# Patient Record
Sex: Male | Born: 1940
Health system: Southern US, Community
[De-identification: ages and names within clinical notes are randomized; demographics above are authoritative.]

## PROBLEM LIST (undated history)

## (undated) DIAGNOSIS — Z9889 Other specified postprocedural states: Secondary | ICD-10-CM

## (undated) DIAGNOSIS — I10 Essential (primary) hypertension: Secondary | ICD-10-CM

## (undated) DIAGNOSIS — I2510 Atherosclerotic heart disease of native coronary artery without angina pectoris: Secondary | ICD-10-CM

## (undated) DIAGNOSIS — E785 Hyperlipidemia, unspecified: Secondary | ICD-10-CM

## (undated) DIAGNOSIS — I428 Other cardiomyopathies: Secondary | ICD-10-CM

## (undated) DIAGNOSIS — Z973 Presence of spectacles and contact lenses: Secondary | ICD-10-CM

## (undated) DIAGNOSIS — R93429 Abnormal radiologic findings on diagnostic imaging of unspecified kidney: Secondary | ICD-10-CM

## (undated) DIAGNOSIS — I480 Paroxysmal atrial fibrillation: Secondary | ICD-10-CM

## (undated) DIAGNOSIS — I498 Other specified cardiac arrhythmias: Secondary | ICD-10-CM

## (undated) DIAGNOSIS — M1990 Unspecified osteoarthritis, unspecified site: Secondary | ICD-10-CM

## (undated) DIAGNOSIS — N1330 Unspecified hydronephrosis: Secondary | ICD-10-CM

## (undated) DIAGNOSIS — Z8679 Personal history of other diseases of the circulatory system: Secondary | ICD-10-CM

## (undated) DIAGNOSIS — E039 Hypothyroidism, unspecified: Secondary | ICD-10-CM

## (undated) DIAGNOSIS — I493 Ventricular premature depolarization: Secondary | ICD-10-CM

## (undated) DIAGNOSIS — K4091 Unilateral inguinal hernia, without obstruction or gangrene, recurrent: Secondary | ICD-10-CM

## (undated) DIAGNOSIS — I499 Cardiac arrhythmia, unspecified: Secondary | ICD-10-CM

## (undated) DIAGNOSIS — I452 Bifascicular block: Secondary | ICD-10-CM

## (undated) DIAGNOSIS — R490 Dysphonia: Secondary | ICD-10-CM

## (undated) HISTORY — PX: TRANSTHORACIC ECHOCARDIOGRAM: SHX275

## (undated) HISTORY — DX: Ventricular premature depolarization: I49.3

## (undated) HISTORY — PX: LOOP RECORDER IMPLANT: SHX5954

## (undated) HISTORY — PX: CARDIAC ELECTROPHYSIOLOGY STUDY AND ABLATION: SHX1294

## (undated) HISTORY — PX: CARDIAC CATHETERIZATION: SHX172

## (undated) HISTORY — PX: OTHER SURGICAL HISTORY: SHX169

## (undated) HISTORY — PX: CARDIOVASCULAR STRESS TEST: SHX262

## (undated) HISTORY — PX: TEE WITH CARDIOVERSION: SHX5442

---

## 1995-03-02 DIAGNOSIS — R490 Dysphonia: Secondary | ICD-10-CM

## 1995-03-02 HISTORY — DX: Dysphonia: R49.0

## 1998-07-14 ENCOUNTER — Emergency Department (HOSPITAL_COMMUNITY): Admission: EM | Admit: 1998-07-14 | Discharge: 1998-07-14 | Payer: Self-pay | Admitting: *Deleted

## 2003-09-09 ENCOUNTER — Inpatient Hospital Stay (HOSPITAL_COMMUNITY): Admission: AD | Admit: 2003-09-09 | Discharge: 2003-09-12 | Payer: Self-pay | Admitting: *Deleted

## 2003-09-10 ENCOUNTER — Encounter (INDEPENDENT_AMBULATORY_CARE_PROVIDER_SITE_OTHER): Payer: Self-pay | Admitting: Cardiology

## 2003-10-10 ENCOUNTER — Ambulatory Visit (HOSPITAL_COMMUNITY): Admission: RE | Admit: 2003-10-10 | Discharge: 2003-10-10 | Payer: Self-pay | Admitting: *Deleted

## 2004-08-26 ENCOUNTER — Ambulatory Visit: Payer: Self-pay | Admitting: Family Medicine

## 2004-08-26 ENCOUNTER — Inpatient Hospital Stay (HOSPITAL_COMMUNITY): Admission: RE | Admit: 2004-08-26 | Discharge: 2004-09-04 | Payer: Self-pay | Admitting: Orthopedic Surgery

## 2004-08-26 HISTORY — PX: TOTAL KNEE ARTHROPLASTY: SHX125

## 2004-08-27 ENCOUNTER — Ambulatory Visit: Payer: Self-pay | Admitting: Orthopedic Surgery

## 2005-01-15 ENCOUNTER — Encounter: Admission: RE | Admit: 2005-01-15 | Discharge: 2005-01-15 | Payer: Self-pay | Admitting: Orthopedic Surgery

## 2005-05-27 ENCOUNTER — Encounter (INDEPENDENT_AMBULATORY_CARE_PROVIDER_SITE_OTHER): Payer: Self-pay | Admitting: Specialist

## 2005-05-27 ENCOUNTER — Ambulatory Visit (HOSPITAL_COMMUNITY): Admission: RE | Admit: 2005-05-27 | Discharge: 2005-05-28 | Payer: Self-pay | Admitting: Surgery

## 2005-08-23 ENCOUNTER — Inpatient Hospital Stay (HOSPITAL_COMMUNITY): Admission: RE | Admit: 2005-08-23 | Discharge: 2005-08-27 | Payer: Self-pay | Admitting: Orthopedic Surgery

## 2005-08-23 DIAGNOSIS — Z96659 Presence of unspecified artificial knee joint: Secondary | ICD-10-CM | POA: Insufficient documentation

## 2005-08-23 HISTORY — PX: TOTAL KNEE ARTHROPLASTY: SHX125

## 2005-09-07 ENCOUNTER — Encounter: Payer: Self-pay | Admitting: Vascular Surgery

## 2005-09-07 ENCOUNTER — Ambulatory Visit (HOSPITAL_COMMUNITY): Admission: RE | Admit: 2005-09-07 | Discharge: 2005-09-07 | Payer: Self-pay | Admitting: Orthopedic Surgery

## 2008-08-08 ENCOUNTER — Encounter: Admission: RE | Admit: 2008-08-08 | Discharge: 2008-08-08 | Payer: Self-pay | Admitting: Cardiology

## 2008-08-14 ENCOUNTER — Ambulatory Visit (HOSPITAL_COMMUNITY): Admission: RE | Admit: 2008-08-14 | Discharge: 2008-08-14 | Payer: Self-pay | Admitting: Cardiology

## 2008-12-30 ENCOUNTER — Encounter: Admission: RE | Admit: 2008-12-30 | Discharge: 2008-12-30 | Payer: Self-pay | Admitting: Sports Medicine

## 2009-08-07 ENCOUNTER — Ambulatory Visit (HOSPITAL_COMMUNITY): Admission: RE | Admit: 2009-08-07 | Discharge: 2009-08-07 | Payer: Self-pay | Admitting: General Surgery

## 2010-05-18 LAB — CBC
HCT: 40.8 % (ref 39.0–52.0)
Hemoglobin: 13.9 g/dL (ref 13.0–17.0)
MCHC: 34.1 g/dL (ref 30.0–36.0)
MCV: 94.8 fL (ref 78.0–100.0)
Platelets: 178 10*3/uL (ref 150–400)
RBC: 4.31 MIL/uL (ref 4.22–5.81)
RDW: 13.7 % (ref 11.5–15.5)
WBC: 8.4 10*3/uL (ref 4.0–10.5)

## 2010-05-18 LAB — BASIC METABOLIC PANEL
BUN: 21 mg/dL (ref 6–23)
CO2: 26 mEq/L (ref 19–32)
Calcium: 9.2 mg/dL (ref 8.4–10.5)
Chloride: 103 mEq/L (ref 96–112)
Creatinine, Ser: 0.82 mg/dL (ref 0.4–1.5)
GFR calc Af Amer: >60 mL/min (ref >60)
GFR calc non Af Amer: >60 mL/min (ref >60)
Glucose, Bld: 90 mg/dL (ref 70–99)
Potassium: 4.7 mEq/L (ref 3.5–5.1)
Sodium: 135 mEq/L (ref 135–145)

## 2010-05-31 DIAGNOSIS — Z9889 Other specified postprocedural states: Secondary | ICD-10-CM

## 2010-05-31 DIAGNOSIS — Z8679 Personal history of other diseases of the circulatory system: Secondary | ICD-10-CM | POA: Insufficient documentation

## 2010-05-31 HISTORY — DX: Personal history of other diseases of the circulatory system: Z86.79

## 2010-05-31 HISTORY — DX: Personal history of other diseases of the circulatory system: Z98.890

## 2010-06-08 LAB — PROTIME-INR
INR: 1.8 — ABNORMAL HIGH (ref 0.00–1.49)
Prothrombin Time: 21.4 seconds — ABNORMAL HIGH (ref 11.6–15.2)

## 2010-07-17 NOTE — Discharge Summary (Signed)
[name], Colton Montgomery               ACCOUNT NO.:  [id number]   MEDICAL RECORD NO.:  [id number]          PATIENT TYPE:  INP   LOCATION:  5034                         FACILITY:  [facility]   PHYSICIAN:  Robert A. Thurston Hole, M.D. DATE OF BIRTH:  1940/11/23   DATE OF ADMISSION:  08/23/2005  DATE OF DISCHARGE:  08/26/2005                                 DISCHARGE SUMMARY   ADMITTING DIAGNOSES:  1.  End-stage degenerative joint disease left knee.  2.  Cardiomyopathy.  3.  Atrial fibrillation.  4.  Hypertension.  5.  History of renal calculi.  6.  History of laryngeal injury with throat surgery x4.   DISCHARGE DIAGNOSES:  1.  End-stage degenerative joint disease left knee status post total knee      replacement.  2.  Cardiomyopathy.  3.  Atrial fibrillation currently in normal sinus rhythm.  4.  Hypertension currently normotensive.  5.  History of renal calculi.  6.  History of laryngeal injury with throat surgery x4.   HISTORY OF PRESENT ILLNESS:  The patient is a 70 year old white male with a  history of end-stage DJD in his left knee. He has failed conservative  treatment. Has known osteoarthritis of both knees. He had a right total  replacement a year ago that had a difficult postoperative course due to  cardiac issues and developing C. difficile as well as urinary retention. We  have discussed the risks, benefits, and possible complications of a left  total knee replacement and is without question.   PROCEDURES IN HOUSE:  On August 23, 2005, the patient underwent a left total  knee replacement by Dr. Thurston Hole and a femoral nerve block by anesthesia. He  was admitted postoperatively for cardiac monitoring, DVT prophylaxis, pain  control, and physical therapy.   HOSPITAL COURSE:  The patient was placed on a CPM in the recovery room and  sent to telemetry for monitoring overnight. He had normal sinus rhythm with  occasional PAC. No runs of atrial fib. He was hypotensive postoperatively,  and  cardiology was consulted to help manage this. We feel the hypotension  was due to the combination of volume loss due to being slightly  anticoagulated and being on multitude of blood pressure medicine.  On exam postoperatively his blood pressure was 92/68. His evening blood  pressures were held. His IV fluids were increased to 100 cc an hour, and he  showed no signs of heart failure. The next morning his blood pressure was up  to 106/67. His Coreg was resumed.      Kirstin Shepperson, P.A.      Robert A. Thurston Hole, M.D.  Electronically Signed    KS/MEDQ  D:  08/26/2005  T:  08/26/2005  Job:  [id number]

## 2010-07-17 NOTE — Op Note (Signed)
NAMEOLAF, MESA               ACCOUNT NO.:  [id number]   MEDICAL RECORD NO.:  [id number]          PATIENT TYPE:  INP   LOCATION:  2550                         FACILITY:  [facility]   PHYSICIAN:  Robert A. Thurston Hole, M.D. DATE OF BIRTH:  October 06, 1940   DATE OF PROCEDURE:  08/26/2004  DATE OF DISCHARGE:                                 OPERATIVE REPORT   PREOPERATIVE DIAGNOSIS:  Right knee degenerative joint disease.   POSTOPERATIVE DIAGNOSIS:  Right knee degenerative joint disease.   PROCEDURE:  1.  Right total knee replacement using DePuy cemented SIGMA total knee      system with #5 cemented femur, #6 cemented tibia with 12.5 mm      polyethylene RP tibial spacer and 41 mm polyethylene cemented patella.  2.  Right total knee computer assisted navigation.   SURGEON:  Elana Alm. Thurston Hole, M.D.   ASSISTANT:  Julien Girt, P.A.   ANESTHESIA:  General.   OPERATIVE TIME:  Two  hours.   ESTIMATED BLOOD LOSS:  50 mL.   COMPLICATIONS:  None.   DESCRIPTION OF PROCEDURE:  Mr. Harr is brought to the operating room on  August 26, 2004, after a femoral nerve block had been placed in the holding  room by anesthesia for postoperative pain control. He is placed on the  operative table in the supine position. After being placed under general  anesthesia. He received Ancef 1 g IV preoperatively for prophylaxis and had  a Foley catheter placed under sterile conditions. His right knee was  examined. He had range of motion from -8 to 125 degrees with a severe varus  deformity of approximately 18 degrees. His knee was stable with normal  patellar tracking. Right leg was prepped using sterile DuraPrep and draped  using sterile technique. Leg was exsanguinated and a thigh tourniquet  elevated 365 mm. Initially through a 15 cm longitudinal incision based over  the patella, initial exposure was made. The underlying subcutaneous tissues  were along with the skin incision. A median arthrotomy was  performed  revealing an excessive amount of normal-appearing joint fluid. The articular  surfaces were inspected. He had complete grade IV chondromalacia and DJD of  the medial, lateral compartment and patellofemoral joint. He had very large  osteophytes on the femoral condyles and tibial plateau which were removed.  Degenerative menisci were removed. There was no remaining anterior cruciate  ligament. There was severe proximal tibial deformity noted. At this point,  two separate pins were placed in the distal femoral metaphysis and two pins  placed in the proximal tibial metaphysis for placement of the computer  navigation system. The system was then activated, the knee taken through a  range of motion, found to have deficiencies and deformities with flexion  contracture of 8 degrees and a varus deformity of 18 degrees. Using the  computer navigation system, an 11 mm distal femoral cut was made in the  appropriate amount of angulation and slope using the computer navigation  system for a perfect cut.  The distal femur was incised. The #5 was felt to  be the appropriate size  based on the computer.  The distal #5 cutting jig  was placed and then these cuts were made also with computer navigation and  verified as well. After this was done then the proximal tibia cut was made  also using computer navigation for resecting 1 mm from the medial or lower  side, 12 mm from the lateral or higher side, again correcting his severe  varus deformity with this navigation cut. After this was done then spacer  blocks were placed for testing the flexion and extension gaps.  These were  found to be equal with 12.5 mm spacers. At this point then the #6 tibial  trial was placed. This was found to give an excellent fit.  There were found  to be very large cysts in the proximal tibial metaphyseal bone.  These were  meticulously cleaned out so that they could be packed with cement and the  end of the case. The #6  tibial tray was placed and the keel cut was made.  After this was done then the PCL box cutter was placed on the distal femur  and then these cuts were made. At this point, the #6 tibial trial was  placed. The #5 femoral trial was placed and with a 12.5 mm polyethylene RP  tibial spacer there was found be excellent restoration of normal alignment  and excellent stability and excellent correction of his deformities.  At  this point, the patella was sized. The patella measured 30 mm in thickness  and a resurfacing 10 mm cut was made and then three locking holes were  placed. After this was done, patellofemoral tracking was evaluated and this  was found to be normal. At this point, it was felt that all the trial  components were of excellent size, fit and stability. They were then  removed.  The knee was then jet lavage irrigated with 3 liters of saline  solution and the proximal tibia was exposed. The #6 tibial tray with cement  backing was placed after the large cysts were filled with cement and then  the tibial tray was hammered into position with an excellent fit, with  excess cement being removed from around the edges.  The #5 femoral component  with cement backing was hammered in position also with an excellent fit,  with excess cement being removed from around the edges. The 12.5 mm  polyethylene RP tibial spacer was then placed on the tibial tray. The knee  was reduced, taken through a range of motion from -3 to 125 degrees with  excellent correction of his varus and flexion deformities. The 41 mm  polyethylene cement backed patella was then placed and held in position with  a clamp. After the cement hardened, patellofemoral tracking was again  evaluated and this was found to be normal. At this point, it was felt that  all components were excellent size, fit and stability. The knee was further  irrigated with saline and then the tourniquet was released. Hemostasis was obtained with  cautery. The arthrotomy was then closed with #1 Ethibond  suture over two medium Hemovac drains. Subcutaneous tissues closed with 0  and 2-0 Vicryl, skin closed with subcuticular 4-0 Monocryl. Steri-Strips  were applied. Sterile dressings were applied. The  Hemovac injected with  0.25% Marcaine with epinephrine and 4 mg of morphine. Knee immobilizer  placed and the patient awakened and taken to recovery in stable condition.  Needle and sponge counts correct x2 at the end of the case.  RAW/MEDQ  D:  08/26/2004  T:  08/26/2004  Job:  [id number]

## 2010-07-17 NOTE — Discharge Summary (Signed)
NAMEIMAN, Colton Montgomery               ACCOUNT NO.:  [id number]   MEDICAL RECORD NO.:  [id number]          PATIENT TYPE:  INP   LOCATION:  5015                         FACILITY:  [facility]   PHYSICIAN:  Robert A. Thurston Hole, M.D. DATE OF BIRTH:  [birth year]   DATE OF ADMISSION:  08/26/2004  DATE OF DISCHARGE:  09/04/2004                                 DISCHARGE SUMMARY   ADMITTING DIAGNOSES:  1.  End-stage degenerative joint disease right knee.  2.  Chronic obstructive pulmonary disease, with ongoing tobacco use.  3.  Paroxysmal atrial fibrillation, with ejection fraction of 25% on September 02, 2004.  4.  History of kidney stones.   DISCHARGE DIAGNOSES:  1.  End-stage degenerative joint disease right knee, status post total knee      replacement.  2.  Chronic obstructive pulmonary disease.  Stable.  No tobacco use or      nicotine patch since August 26, 2004.  3.  Paroxysmal atrial fibrillation, currently in normal sinus rhythm.  4.  Urinary retention.  5.  History of confusion, now resolved.   HISTORY AND PRESENT ILLNESS:  The patient is a 70 year old white male with a  history of bilateral end-stage DJD of both knees.  Tried conservative care,  including antiinflammatories and cortisone injections without success.  At  this point in time, he has pain at night, pain with rest, pain unrelieved by  medication, and understands the risks, benefits, and possible complications  of a right total knee replacement and is without question.   Due to his significant cardiac history, he received cardiac clearance from  Dr. Jenne Campus at [facility] who will follow him during his  hospital course.   PROCEDURES IN-HOUSE:  On August 26, 2004, the patient underwent a right total  knee replacement by Dr. Thurston Hole and a femoral nerve block by anesthesia.  He  tolerated both procedures well.   He was placed in cardiac intensive care postoperatively due to his history  of paroxysmal atrial  fibrillation and his ejection fraction of 25%.  Postop  day one, vital signs were stable.  The patient was afebrile.  He was  neurovascularly intact.  His hemoglobin was 12.9.  Sodium 132, potassium  4.5.  His PCA was 4.5.  His PCA was discontinued.  He was started on  OxyContin 20 mg b.i.d. and Oxy-IR.  He was in atrial fibrillation and was  getting Lovenox for DVT prophylaxis as well as Coumadin.  Postop day two,  the patient was transferred to telemetry.  His sodium was 126.  His INR was  2.3.  He had a T-max of 100.  His blood pressure was 94/66.  He did have  some gross blood in his urine.  A UA was ordered.  A chest x-ray was  ordered.  His IV was hep-locked.  Urology was consulted due to gross  hematuria in his catheter.  Urology recommended his catheter remain in and  felt the hematuria was due to his Coumadin use and so it was best monitored  with the catheter left in.  He continued to be slightly hypotensive.  His  blood pressure medicines were adjusted by cardiology.  He went in and out of  atrial flutter and atrial fibrillation into sinus rhythm.  He was evaluated  for possible rehab versus [facility] admission.  [facility] denied  subacute.  He is still being followed for rehab.  On postop day three, blood  pressure was 105/75.  He returned from sinus rhythm back into atrial  fibrillation.  The patient began to have some confusion and was unable to  participate very well in physical therapy due to his new onset confusion.  On postop day four, hemoglobin was 8.5.  Sodium was 1.9.  Surgical wound was  well approximated.  Blood pressure was 93/76.  He was in sinus rhythm with  PVCs.  He was stable from a cardiac standpoint and transferred to the  orthopedic floor.  At that time, his confusion was so significant he was not  able to follow instructions and was getting out of bed and was unsafe.  He  was placed in restraints.  He was given a unit of blood.  He was oriented to   person, place, and situation, but still very inappropriate.  Was not  following safety instructions.  Had a T-max of 101.  Had a postvoid residual  of 600 cc.  Ativan was started for his agitation which we thought could be  alcohol or nicotine withdrawal.  Chest x-ray, UA with culture and  sensitivity, and blood cultures were ordered.  He was given 1 unit of blood  for his postop blood loss anemia as well as his hypotension.  He was given a  Dulcolax suppository.  Postop day five, the patient was once again oriented  to person, place, and situation when asked appropriate questions but still  disoriented.  His INR was 3.5.  His hemoglobin was 8.5.  His UA had white  cells and bacteria in it as well as blood.  He was started on clindamycin.  His surgical wound was well approximated.  Had a moderate amount of  drainage, with broken blisters.  Postop day six, patient still was unable to  clear his confusion despite being oriented to person and place.  Family  practice was consulted.  They discontinued all narcotics, discontinued his  Ativan, gave a one-time dose of Haldol, and the patient continued to be very  agitated.  He was given a second unit of blood.  His blood pressure improved  to 122/68; T-max of 100.2.  His hemoglobin was 70 to 11.3, with 2 units of  blood.  His INR was 3.  He converted back to atrial fibrillation.  His  Avapro was restarted now that his hypotension was improved.  Postop day  seven, the patient still continued to be somewhat confused but clearing  slightly.  His chest x-ray was clear.  His urine culture did not grow  anything.  Therefore, his Foley was discontinued again.  Postop day eight,  the patient's biggest complaint was a slight bit of urinary retention.  Urecholine was switched to Flomax 0.4 mg.  Dressing changes were ordered.  He progressed well with physical therapy.  Clindamycin was switched to Keflex due to just treating his wound.  Postop day nine, blood  pressures  95/68; pulse 65; respirations 16; O2 saturations are between 94-98%.  His  INR is 2.3.  He is being discharged to a skilled nursing facility in stable  condition, weightbearing as tolerated, on a regular  diet.  He needs daily  dressing changes.  Swab the wound with Betadine.  Place Adaptic over the  wound and dry 4 x 4s, and then place his TED hose over the wound.   DISCHARGE MEDICATIONS:  1.  Avapro 75 mg one p.o. daily.  2.  Coreg 25 mg one p.o. b.i.d.  3.  Inspra 25 mg one p.o. Monday, Wednesday, and Friday.  4.  Digoxin 0.125 mg Tuesday, Thursday, Saturday, and Sunday.  5.  Amiodarone 200 mg p.o. q.a.m.  6.  Diovan 80 mg p.o. q.a.m.  7.  Vytorin 10/20 one p.o. at bedtime.  8.  Coumadin 1.25 mg Tuesday, Thursday, Saturday, and Sunday.  9.  Coumadin 2.5 mg Monday, Wednesday, and Friday.  10. Ultram 50 mg 1-2 p.o. q.4-6 h. p.r.n. pain.   He needs a CPM machine 0-90 degrees eight hours a day for two weeks.  Every  morning, his right heel needs to be placed on a folded pillow to elevate his  leg and work on extension of his knee. This needs to be done for 30 minutes  every morning.  He needs physical therapy daily for ambulation, balance,  range of motion of his knee.  He is to  follow up with Dr. Thurston Hole in one week for suture removal and x-rays.  Our  office number is [phone].  Please call for an appointment.  Call [phone]  with increased pain, increased redness, increased drainage, or a temperature  greater than 101.       KS/MEDQ  D:  09/04/2004  T:  09/04/2004  Job:  [id number]

## 2010-07-17 NOTE — Consult Note (Signed)
NAMEGAELAN, Colton Montgomery               ACCOUNT NO.:  [id number]   MEDICAL RECORD NO.:  [id number]          PATIENT TYPE:  INP   LOCATION:  5015                         FACILITY:  [facility]   PHYSICIAN:  Altamese Cabal, M.D.  DATE OF BIRTH:  1941-02-03   DATE OF CONSULTATION:  09/01/2004  DATE OF DISCHARGE:                                   CONSULTATION   REASON FOR CONSULTATION:  Confusion.Marland Kitchen   HISTORY OF PRESENT ILLNESS:  This is a 70 year old white male with COPD,  atrial fibrillation, CHF, hyperlipidemia, hypertension, who was admitted to  the hospital for a right total replacement six days ago.  His primary team  asked [place] to come and evaluate the patient for confusion x2 days.  Per the  nurses, the patient had been intermittently confused, meaning that he did  now always respond appropriately to questions, frequently needs redirection,  has a decreased attention span, and is disoriented at times.  The patient  has become agitated enough at times to require restraints and is presently  getting Ativan as needed.  The patient has also experienced hypotension  since his operation, with blood pressures ranging from the 60s to 90s over  40s to 60s.  These have since increased in the last day and are now around  100/60.  The patient is also being treated for a UTI and is on day #2 of  Clindamycin.  The patient's son is here today and he believes his father is  very confused and that this began immediately following surgery.  The son  states his father is typically a high functioning individual and is a high  Engineer, site.  He has never had problems like this before.  Both the  patient and son deny him having recent alcohol use.  The son does state that  in the past he has had an alcohol abuse problem though.  Currently the  patient is having right knee pain as well as nausea, and an occasional  questionably productive cough.  His last bowel movement was three days ago.  The patient denies  chest pain, shortness of breath dizziness, headaches,  visual changes, fever, chills, and weakness, numbness, or tingling in his  extremities.   PAST MEDICAL HISTORY:  1.  COPD:  Not on home O2.  2.  Atrial fibrillation:  Presently on Coumadin.  3.  CHF:  Ejection fraction of 20-25% on echo in July 2005 and evidence of      ischemic cardiomyopathy.  4.  Hypertension.  5.  Hyperlipidemia.  6.  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  7.  History of right inguinal hernia.   PAST SURGICAL HISTORY:  1.  Status post right total knee replacement six days ago.  2.  Left knee arthroscopy, 1981.  3.  Larynx repair, 1994, x3.   HOSPITAL MEDICATIONS:  1.  Amiodarone 200 mg p.o. b.i.d.  2.  Aspirin 81 mg p.o. daily.  3.  __________25 mg p.o. t.i.d.  4.  Coreg 25 mg p.o. b.i.d.  5.  Clindamycin 300 mg p.o. q.i.d., day #2.  6.  Lanoxin  0.125 mg per cardiologist.  7.  Colace 100 mg p.o. b.i.d.  8.  Zetia 10 mg p.o. daily.  9.  Senna p.r.n.  10. Zocor 20 mg p.o. daily.  11. Coumadin per cardiologist.  12. Thiamine 100 mg p.o. daily.  13. Ativan 1 mg p.o. every six hours as needed.  14. Robaxin p.r.n.  15. Reglan p.r.n.  16. Restoril 15-30 mg p.o. q.h.s. p.r.n.  17. Oxycodone 5 mg p.o. q.4h. p.r.n.  18. Percocet 1-2 tabs p.o. q.4h. p.r.n.  19. Phenergan p.r.n.   FAMILY HISTORY:  Significant for father with Alzheimer's, deceased in 44s.  No family history of stroke, brain cancer, or seizures.   SOCIAL HISTORY:  The patient lives alone and is divorced.  He is a Runner, broadcasting/film/video  at [employer].  He denies alcohol and drug use.  He admits to  smoking about a pack a day.   PHYSICAL EXAMINATION:  VITAL SIGNS:  Temperature 97.6, blood pressure 101-  114/55-70, pulse 70, respirations 20.  Oxygen 91-93% on 2 L nasal cannula,  24 hour I's & O's:  480/1980.  GENERAL:  The patient is calm.  He is sleepy but arouseable.  He is oriented  to his name but not to the place, time, or  situation.  His attention span is  also decreased.  The patient is cooperative but does not respond  appropriately to questions and demonstrates tangential speech.  SKIN:  There is a 6 cm oval ecchymosis in the right lower quadrant and an  underlying nodule is palpated.  HEENT:  Head normocephalic, atraumatic.  No nuchal rigidity.  Pupils equal,  round, and reactive to light and accommodation.  Extraocular movements  intact.  Oropharynx clear.  Dentition is fair.  LYMPHS:  No cervical or inguinal nodes palpable.  LUNGS:  Decreased air movement throughout, otherwise clear to auscultation  bilaterally.  CARDIOVASCULAR:  Regular rate and rhythm.  No murmurs, rubs, or gallops.  ABDOMEN:  Positive bowel sounds.  Soft, nontender, and nondistended.  EXTREMITIES:  His right knee is immobilized.  His incision is clean, dry,  and intact.  NEUROLOGIC:  Sensation is 5/5 throughout, normal sensation throughout.  Cranial nerves II-XII grossly intact.   LABORATORY DATA:  Sodium 130, potassium 4.2, chloride 101, bicarb 24, BUN  15, creatinine 0.9, glucose 117.  White blood cell count 11.3, hemoglobin  10.1, hematocrit 29.5, platelets 287.  UA with moderate loops.  Urine micro:  7-10 white blood cells, 21-50 red blood cells.  Urine culture:  No growth,  final.  Blood culture:  No growth at the time of two days.  PT 35.4, INR  3.5.  AST 62, ALT 43.  Total bilirubin 1.1, alkaline phosphatase 46, total  protein 5.6, albumin 2.2.   Chest x-ray on July 2 demonstrated linear atelectasis in the lingula and  right middle lobe.   ASSESSMENT/PLAN:  This is a 70 year old male, status post right total knee  replacement, presenting with acute confusion x2 days.  1.  Delirium.  We feel that the acute nature and waxing and waning mental      status are consistent with delirium.  At baseline the patient is an     independent and high functioning individual.  The differential for this      includes medications,  metabolic abnormalities, constipation, and      infection.  The patient's low O2 saturations and productive cough      warrant a chest x-ray and ABG to evaluate for  hypoxia as well as for an      infiltrate.  Urine culture is negative.  We may consider stopping the      antibiotics and we have not identified a source of infection yet.  The      patient's wound did not seem to be infected either.  We recommend      holding the medications that may be contributing to his delirium, such      as Ativan, Restoril, Reglan, and Phenergan.  Instead of Ativan,      recommend using Haldol 1 mg for agitation.  Also should consider      discontinuing the Percocet and adding Ultram in its place.  The patient      did not have any focal neurology extensions or head trauma, so we doubt      a CVA or epidural hematoma is responsible for his condition.  The      patient's son also denies that he uses alcohol, so we doubt that this is      alcohol withdrawal, especially given that he has no tachycardias or      tremors or other signs of acute withdrawal.  The patient also has not      had a bowel movement in three days and a more aggressive bowel regimen      may be helpful since constipation can cause delirium.  We also recommend      checking a TSH.  2.  Increased LFTs.  His levels were normal before admission, so this is an      acute change.  We will check an ammonia level for possible hepatic      encephalopathy.  We also will consider a right upper quadrant ultrasound      at a later time.  It is possible that he has shock liver from      hypotension during surgery.  That is also in the differential.  3.  Anemia.  He is status post transfusion.  We recommend keeping the      hemoglobin above 10 due to his coronary artery disease.  4.  Congestive heart failure.  This is stable.  The cardiologist is      managing.  5.  Atrial fibrillation.  This is also being managed per the patient's       cardiologist.       KS/MEDQ  D:  09/01/2004  T:  09/01/2004  Job:  [id number]

## 2010-07-17 NOTE — Op Note (Signed)
Colton Montgomery, Colton Montgomery               ACCOUNT NO.:  [id number]   MEDICAL RECORD NO.:  [id number]          PATIENT TYPE:  INP   LOCATION:  2550                         FACILITY:  [facility]   PHYSICIAN:  Robert A. Thurston Hole, M.D. DATE OF BIRTH:  Jan 03, 1941   DATE OF PROCEDURE:  08/23/2005  DATE OF DISCHARGE:                                 OPERATIVE REPORT   PREOPERATIVE DIAGNOSIS:  Left knee degenerative joint disease.   POSTOPERATIVE DIAGNOSIS:  Left knee degenerative joint disease.   PROCEDURE:  1.  Left total knee replacement using DePuy cemented total knee system with      #5 cemented femur, #6 cemented tibia, with 12.5 mm polyethylene RP      tibial spacer and 38 mm polyethylene cemented patella.  2.  Left total knee computer assisted navigation.   SURGEON:  Elana Alm. Thurston Hole, M.D.   ASSISTANT:  Julien Girt, P.A.   ANESTHESIA:  General.   OPERATIVE TIME:  1 hour and 40 minutes.   COMPLICATIONS:  None.   DESCRIPTION OF PROCEDURE:  Mr. Mcdonagh is brought to the operating room on  August 23, 2005, placed on the operative table in the supine position after a  femoral nerve block was placed in the holding room by Anesthesia.  After  being placed under general anesthesia he had a Foley catheter placed under  sterile conditions.  He received vancomycin 1g IV preoperatively for  prophylaxis.  His left knee was examined under anesthesia.  Range of motion  from -8 to 125 degrees with significant varus deformity, knee stable  ligamentous exam with normal patella tracking.  The left leg was prepped  using sterile DuraPrep and draped using sterile technique.  Leg was  exsanguinated and a thigh tourniquet elevated at 365 mm.  Initially through  a 15 cm longitudinal incision based over the patella initial exposure was  made.  The underlying subcutaneous tissues were incised along with the skin  incision.  A median arthrotomy was performed revealing an excessive amount  of normal appearing  joint fluid.  The articular surfaces were inspected.  He  had grade 4 changes medially, grade 3 and 4 changes laterally and grade 3  and 4 changes at the patellofemoral joint.  Osteophytes were removed off the  femoral condyles and tibial plateau.  Medial and lateral meniscal remnants  were removed as well as the anterior cruciate ligament.  At this point two  pins were placed in the proximal tibial metaphysis and two pins in the  distal femoral metaphysis for placement of the computer navigation system.  The navigation system was set up and activated.  Deformities were  registered.  He had a 10 degree flexion contracture and a 17 degree varus  deformity.  At this point, the distal femoral cut was made using the  computer navigation system, making a perfect and exact cut, resecting 12 mm  off the distal femur, this cut was verified.  The sizing guide was used  along with the navigation system to determine the #5 size as the appropriate  size.  With the #5 cutting jig in  place, the anterior, posterior and chamfer  cuts were made and verified as well.  At this point, the proximal tibial  guide was placed with the navigation system, resecting 11 mm off the lateral  and 2 mm off the medial side as a correcting cut.  This cut was made and  also verified as well and found to be exact.  Spacer blocker were then used,  a 12.5 mm flexion block was used and a 12.5 mm extension block.  This was  found to give excellent correction of his flexion and varus deformities  noted and excellent stability and balancing noted as well.  At this point,  the #6 tibial base plate tray was placed and the keel cut was made.  The PCL  box cutter was placed on the distal femur and these cuts were made.  The #5  femoral trial was placed and with a #6 tibial base plate and a 60.4 mm  polyethylene RP tibial spacer the knee was reduced, taken through a range of  motion from 0-125 degrees with excellent stability and  excellent correction  of his flexion and varus deformities.  The patella was sized.  A resurfacing  10 mm cut was made and three locking holes were placed for a 38 mm patella  and with the patella trial in place patellofemoral tracking was evaluated  and this was found to be normal.  At this point, it was felt that all of the  components were of excellent size, fit and stability.  The navigation system  was deactivated and the pins were removed.  The trial components were  removed.  The knee was then gent lavage irrigated with 3L of saline.  The  proximal tibia was then exposed and a #6 tibial base plate with cement  backing was hammered into position with an excellent fit, with excess cement  being removed from around the edges.  A #5 femoral component was cemented  back and was hammered into position also with an excellent fit, with excess  cement being removed from around the edges.  The 12.5 mm polyethylene RP  tibial spacer was placed on the tibial base plate, the knee reduced, taken  through a range of motion from 0-125 degrees with excellent stability and  excellent correction of his flexion and varus deformities.  The 38 mm  polyethylene cement backed patella was placed in its position and held there  with a clamp.  After the cement hardened patellofemoral tracking was again  evaluated and this was found to be normal.  At this point, it was felt that  all of the components were of excellent size, fit and stability.  The knee  was further irrigated with saline and then the tourniquet was released,  hemostasis obtained with cautery.  The arthrotomy was then closed with #1  Ethibond suture over two medium Hemovac drains, subcutaneous tissues closed  with #0 and #2-0 Vicryl, subcuticular layer closed with #3-0 Monocryl, Steri-  Strips were applied, sterile dressings and a long leg splint applied, the Hemovac injected with 0.25% Marcaine with epinephrine and 4 mg of morphine  and  clamped.  Patient then awakened, extubated and taken to the recovery  room in stable condition.  Needle and sponge counts correct x2 at the end of  the case.      Robert A. Thurston Hole, M.D.  Electronically Signed     RAW/MEDQ  D:  08/23/2005  T:  08/23/2005  Job:  [id number]

## 2010-07-17 NOTE — Consult Note (Signed)
Colton Montgomery, Colton Montgomery               ACCOUNT NO.:  [id number]   MEDICAL RECORD NO.:  [id number]          PATIENT TYPE:  INP   LOCATION:  3735                         FACILITY:  [facility]   PHYSICIAN:  Ronald L. Earlene Plater, M.D.  DATE OF BIRTH:  1940/12/04   DATE OF CONSULTATION:  DATE OF DISCHARGE:                                   CONSULTATION   DATE OF UROLOGY CONSULTATION:  August 28, 2004.   REASON FOR CONSULTATION:  Gross hematuria seen in catheterization.   HISTORY OF PRESENT ILLNESS:  Patient is admitted for right total knee  replacement by Dr. Thurston Hole for right knee degenerative joint disease.  Also  has a history of atrial fibrillation with control on Coumadin and found to  have pink urine in catheter bag this a.m.  No complaints of abdominal pain.  Catheter was removed around noon today without spontaneous void as yet.   UROLOGICAL HISTORY:  He has had previous histories of kidney stones in the  1980s, some occasional frequency and nocturia.   PAST MEDICAL HISTORY:  1.  Hypertension.  2.  Hyperlipidemia.  3.  Atrial fibrillation.  4.  Separate vocal cords causing chronic hoarseness.  5.  Right inguinal hernia.   PAST SURGICAL HISTORY:  1.  Left knee arthroscopy in 1981.  2.  Left meniscectomy in 1961.  3.  Larynx surgery in 1994 with laser x3.   MEDICATIONS:  1.  Coreg 25 mg b.i.d.  2.  Inspra 25 mg every other day.  3.  Amiodarone 20 mg daily.  4.  Diovan 80 mg daily.  5.  Digoxin 0.125 mg every other day.  6.  Vytorin 10/20 daily.  7.  ASA 81 mg daily.  8.  Coumadin 2.5 mg Monday, Wednesday, Friday.  9.  Coumadin 1.25 mg Tuesday, Thursday, Saturday, and Sunday.   ALLERGIES:  TETRACYCLINE causes rash.   SOCIAL HISTORY:  Occasional alcohol use.  One pack per day smoker for 30  years.   REVIEW OF SYSTEMS:  Pain to right knee.  There is chest pain.  No shortness  of breath.  Otherwise negative.   PHYSICAL EXAMINATION:  GENERAL:  A 70 year old white male in no acute  distress.  VITAL SIGNS:  Stable.  HEENT:  Atraumatic, normocephalic, a hoarse speaking voice.  NECK:  Negative JVD.  ABDOMEN:  Soft and nontender.  GU:  Penis and meatus without lesions.  Dry blood at tip of penis.  SKIN:  Warm and dry.  NEUROLOGIC:  He is alert and oriented x3.   LABORATORY DATA:  Negative urine culture on August 26, 2004.  WBCs 18.1,  hemoglobin 11.1, hematocrit of 33.1, platelets 166.  Sodium 126, potassium  3.8, chloride 97, CO2 23, BUN 22, creatinine 1.0, glucose 128, and calcium  7.7.  His PT 25.1 and INR 23.   IMPRESSION:  1.  Status post right total knee replacement.  2.  Atrial fibrillation.  3.  Hematuria secondary to Coumadin use and catheter.   PLAN:  If no spontaneous void may replace catheter.  Have patient follow up  in our office status post  discharge from hospital with Cammy Copa,  Nurse Practitioner, [facility], number is [phone], ext. [phone].       JML/MEDQ  D:  08/28/2004  T:  08/28/2004  Job:  [id number]

## 2010-07-17 NOTE — Discharge Summary (Signed)
NAMECONROY, GORACKE               ACCOUNT NO.:  [id number]   MEDICAL RECORD NO.:  [id number]          PATIENT TYPE:  INP   LOCATION:  5015                         FACILITY:  [facility]   PHYSICIAN:  Robert A. Thurston Hole, M.D. DATE OF BIRTH:  September 14, 1940   DATE OF ADMISSION:  08/26/2004  DATE OF DISCHARGE:                                 DISCHARGE SUMMARY   ADDENDUM:  job [id number]   Due to urinary retention issues patient had catheter inserted this morning  which is September 04, 2004.  Catheter will remain in place.  He will need a  follow-up with Dr. Vonita Moss who is his urologist some time next week for a  trial.  He will continue on Flomax 0.4 mg p.o. daily until his follow-up  appointment with Dr. Vonita Moss.  He will need b.i.d. dressing changes to his  right leg that has serosanguineous drainage, swab the wound with Betadine,  place adaptic over it, 4 x 4s, ABDs, hold in place with ted hose.  He has a  moderate amount of serous drainage at this point in time.  Please call [phone], Dr. Sherene Sires office, if drainage increases, redness increases,  swelling increases, or pain increases.       KS/MEDQ  D:  09/04/2004  T:  09/04/2004  Job:  [id number]

## 2010-07-17 NOTE — Discharge Summary (Signed)
NAME:  Colton Montgomery, Colton Montgomery                         ACCOUNT NO.:  [id number]   MEDICAL RECORD NO.:  [id number]                   PATIENT TYPE:  INP   LOCATION:  3739                                 FACILITY:  [facility]   PHYSICIAN:  Darlin Priestly, M.D.             DATE OF BIRTH:  December 15, 1940   DATE OF ADMISSION:  09/09/2003  DATE OF DISCHARGE:  09/12/2003                                 DISCHARGE SUMMARY   ADMISSION DIAGNOSES:  1. New-onset atrial fibrillation undetermined duration, asymptomatic.  2. Mild dyspnea on exertion.  3. Chronic obstructive pulmonary disease with ongoing tobacco use.  4. Degenerative joint disease with scheduled right knee replacement and left     knee arthroscopy.   DISCHARGE DIAGNOSES:  1. New found asymptomatic atrial fibrillation, duration unknown.  2. Nonischemic cardiomyopathy with ejection fraction of 20-25%.  3. Left to right atrial shunt by transesophageal echocardiogram, left     ventricular dilatation, left atrial dilatation by transesophageal     echocardiogram.  4. Noncritical coronary artery disease with 50% mid right coronary artery,     proximal 30% distal, 40% left anterior descending stenosis, 30% obtuse     marginal-2 stenosis.  5. Ongoing tobacco use.  6. Degenerative joint disease of the back, shoulders and knees.   PROCEDURES:  1. Transesophageal echocardiogram on September 10, 2003.  2. Cardiac catheterization on September 11, 2003, by Dr. Jenne Campus.   HISTORY OF PRESENT ILLNESS:  The patient is a 70 year old, white male who  was scheduled for arthroscopy of this left knee to be followed by right knee  replacement on August 23, 2003.  Upon presentation for surgery, he was found  to be in atrial fibrillation and referred to our office for evaluation.  The  patient had no indication that he was in atrial fibrillation.  He has no  history of chest pain or coronary artery disease.  He did have some dyspnea  on exertion which he attributed to smoking  and his degenerative joint  disease.   PAST MEDICAL HISTORY:  Unremarkable.   PAST SURGICAL HISTORY:  1. Vocal surgery in the past secondary to an accident.  This required four     procedures.  He has a hoarse voice from that.  2. Right inguinal hernia repair.  3. Kidney stones.  4. Arthritis in both knees which are fairly severe.  He takes Tylenol and     ibuprofen.   ALLERGIES:  No known drug allergies.   SOCIAL HISTORY:  He is divorced with one child.  He is a Runner, broadcasting/film/video at  [employer].  He smokes one pack a day and drinks  occasionally.   FAMILY HISTORY:  Noncontributory.   REVIEW OF SYMPTOMS:  Unremarkable.   ASSESSMENT:  He was recommended to undergo admission at this time and to be  placed on heparin and Coumadin.  The patient refused and took the risk of  using  aspirin only.  He returned for follow up on August 30, 2003.  During the  interim, he had undergone a 2D echocardiogram and a Cardiolite.  The 2D  echocardiogram showed a severely depressed left ventricular systolic  function estimated at 20%.  Cardiolite scan revealed an inferior wall  artifact, but no significant anemia.  He was finally convinced to allow  himself to be admitted with plans to place him on intravenous heparin,  cardiac catheterization and transesophageal echocardiogram guided  cardioversion assuming that he had no clot at the time of Coumadin.   MEDICATIONS ON ADMISSION:  1. Aspirin 325 mg daily.  2. Ibuprofen p.r.n.  3. Tylenol p.r.n. for his arthritis.   HOSPITAL COURSE:  The patient was admitted and underwent TEE.  This showed  mildly dilated left ventricle.  Overall, ventricular systolic function was  markedly decreased and estimated to be 25%.  There was severe diffuse LV  hypokinesis.  There was trivial aortic valvular regurgitation and left  atrial appendage.  Emptying velocities were less than 20 cm per second, left  atrium was markedly dilated.  There was left atrial  appendage function  severely reduced.  He had a normal right ventricle.  The right atrium was  mildly dilated and there was evidence for a mild left to right shunt by  contrast with agitated saline.  It was recommended at this point that he  undergo anticoagulation prior to DC cardioversion.  On September 11, 2003, he  underwent cardiac catheterization by [facility].  This showed a noncritical  coronary disease.  He had a 50% mid RCA stenosis.  He had a 30% proximal  before the diagonal-1 and 40% distal one-third stenosis of the LAD.  His  circumflex was normal.  The OM-2 had a proximal one-third 30% stenosis.  His  left ventricular function was estimated at 20% with global hypokinesis.  At  that point, it was Dr. Mikey Bussing opinion that she should resume heparin and  begin Coumadin and he could go home when his INR was greater than or equal  to 2.0.  Today, the patient is extremely anxious to go home.  His  catheterization site looks good.  He is having no chest discomfort and no  shortness of breath.  His heart rate is in the 70s-90s, so his rate is  controlled.  He remains in fibrillation/flutter rhythm.  His blood pressure  is adequate between 108/80 and as high as 132/92.  Hemoglobin is 16,  hematocrit 46.5, white count 12.1 and platelets 218.  Electrolytes are  normal with BUN 16 and creatinine 0.8.  His protime is 14.8 with an INR of  1.2.  The patient has had 10 mg of Coumadin so far.  At this point, we  contacted case manager and the patient is eligible for Lovenox through his  insurance.  We are going to arrange that and when he is seen by a physician,  we will plan to discharge him home.   DISCHARGE MEDICATIONS:  1. Lovenox 80 mg p.o. b.i.d., four days worth with one refill.  2. Coumadin 10 mg on Thursday, 10 mg on Friday, 5 mg on Saturday, 5 mg on     Sunday and he will return on Monday a.m. for a protime.  He will be    followed by Dr. Mikey Bussing office.  3. Altace 2.5 mg q.12h.  4.  Coreg 3.125 mg q.12h.  5. Aspirin 81 mg daily.  6. Lanoxin 0.125 mg q.d.  7. Advil 800  mg tablets q.12h.  8. Tylenol for pain control.   ACTIVITY:  Light to moderate activity as tolerated.   SPECIAL INSTRUCTIONS:  He is to call if he has any problems with his cardiac  catheterization site.  He is to contact Dr. Sherene Sires office for a follow-up  appointment.  I did talk with his P.A. and she knows he will not be operated  on with an EF of 20%.  We have also advised the patient to discontinue  smoking.  The patient does not have a primary care or medical physician.   FOLLOW UP:  Will schedule him to come back to see Dr. Jenne Campus in  approximately two weeks.  We will have his protime checked Monday and place  him on Coumadin protocol at the lab or his choice so he can maintain  adequate INR.   CONDITION ON DISCHARGE:  Stable.      Eber Hong, P.A.                 Darlin Priestly, M.D.    WDJ/MEDQ  D:  09/12/2003  T:  09/12/2003  Job:  [id number]   cc:   Molly Maduro A. Thurston Hole, M.D.  [address]  [place] [address]  Fax: [phone]

## 2010-07-17 NOTE — Cardiovascular Report (Signed)
NAME:  Colton Montgomery, Colton Montgomery                         ACCOUNT NO.:  [id number]   MEDICAL RECORD NO.:  [id number]                   PATIENT TYPE:  INP   LOCATION:  3739                                 FACILITY:  [facility]   PHYSICIAN:  Darlin Priestly, M.D.             DATE OF BIRTH:  [birth year]   DATE OF PROCEDURE:  09/11/2003  DATE OF DISCHARGE:                              CARDIAC CATHETERIZATION   PROCEDURES:  1. Right heart catheterization.  2. Left heart catheterization.  3. Coronary angiography.  4. Left ventriculogram.  5. Ascending aortography.   ATTENDING PHYSICIAN:  Darlin Priestly, M.D.   COMPLICATIONS:  None.   INDICATIONS:  Colton Montgomery is a 70 year old male with no prior cardiac  history who was initially referred to our office through Dr. Salvatore Marvel  after the patient was noted to have new onset atrial fibrillation in  preparation for knee surgery.  Subsequent 2-D echocardiogram revealed EF of  approximately 20%.  He subsequently was admitted to the hospital for IV  heparin, Coumadin, possible TE guided cardioversion and cardiac  catheterization to rule out significant coronary artery disease.   DESCRIPTION OF OPERATION:  After obtaining informed written consent, the  patient was brought to the cardiac catheterization laboratory.  Right and  left groin were  shaved and prepped and draped in the usual sterile fashion.  ECG monitor was  established.  Using the modified Seldinger technique, a 8 French arterial  sheath was inserted in the right femoral vein, 6 French arterial sheath  inserted in the right femoral artery.  Next, under fluoroscopic guidance a 7  French Swan-Ganz catheter was then floated into the RA, RV, PA and wedge  position and hemodynamic measurements were made.  6 French diagnostic  catheter was then used to perform diagnostic angiography.  This reveals a  large, but short left main with no significant disease. The LAD is a large  vessel which  coursed to the apex with one diagonal branch.  The LAD is noted  to have some mild irregularities up to 30% in its proximal portion with 40%  mid vessel irregularity.  First diagonal is a medium size vessel with no  significant disease.   Left circumflex is a medium size vessel which courses in the AV groove and  gives rise to three obtuse marginal branches.  The AV groove circumflex has  no significant disease.  The first OM is a medium size vessel with no  significant disease.  The second OM is a large vessel with 30% proximal  stenosis.  The third OM is a small vessel with no significant disease.   The right coronary artery is a large vessel which is dominant and gives rise  to both PDA as well as posterior lateral branch.  There is mild 50% early  mid vessel stenosis.  The PDA and posterior lateral branch have no  significant disease.   LEFT VENTRICULOGRAM:  Left ventriculogram reveals severely depressed EF of  20% with global hypokinesis.   ASCENDING AORTOGRAPHY:  Reveals mildly dilated ascending aorta with no  significant aortic regurgitation.   HEMODYNAMICS:  Right atrial pressure of 8, RV 20/6, PA 27/13, pulmonary  capillary wedge pressure 12, systemic arterial pressure 120/79, LV systemic  pressure 123/80, LVEDP 10.  Cardiac output 3.5, cardiac index 1.7.  PA  saturation 62%.  AO saturation 95%.   CONCLUSIONS:  1. No significant coronary artery disease.  2. Severely depressed left ventricular systolic function.  3. Mildly dilated ascending aorta.  4. Normal wedge and right heart pressures.  5. Cardiac output 3.5, cardiac index 1.7.  6. PA saturation 62%, AO saturation 95%.                                               Darlin Priestly, M.D.    RHM/MEDQ  D:  09/11/2003  T:  09/11/2003  Job:  [id number]   cc:   Molly Maduro A. Thurston Hole, M.D.  [address]  [place] [address]  Fax: [phone]

## 2010-07-17 NOTE — Op Note (Signed)
NAME:  Colton Montgomery, Colton Montgomery               ACCOUNT NO.:  [id number]   MEDICAL RECORD NO.:  [id number]          PATIENT TYPE:  AMB   LOCATION:  SDS                          FACILITY:  [facility]   PHYSICIAN:  Sandria Bales. Ezzard Standing, M.D.  DATE OF BIRTH:  June 23, 1940   DATE OF PROCEDURE:  05/27/2005  DATE OF DISCHARGE:                                 OPERATIVE REPORT   PREOPERATIVE DIAGNOSIS:  Large right inguinal hernia.   POSTOPERATIVE DIAGNOSIS:  Large indirect right inguinal hernia.   PROCEDURE:  Open right inguinal hernia repair with mesh.   SURGEON:  Sandria Bales. Ezzard Standing, M.D.  No first assistant.   ANESTHESIA:  General with approximately 30 mL of 0.25% Marcaine.   COMPLICATIONS:  None.   DESCRIPTION OF PROCEDURE:  Colton Montgomery is a 70 year old white male, who is  somewhat of a character, has had a longstanding right inguinal hernia for at  least for four or five years.  The hernia appears to be chronically  incarcerated.  He now comes for repair of this hernia.  He has been on  Coumadin for atrial fibrillation and has been off of this for about five  days, and his preoperative INR is 1.2, within range of proceeding with  surgery.   The indications and potential complications were explained to the patient.  The potential complications include but are not limited to bleeding,  infection, nerve injury and recurrence of the hernia.  The patient now comes  for attempted open right inguinal hernia repair.   OPERATIVE NOTE:  Patient in supine, given a general endotracheal anesthetic.  His abdomen was  shaved, prepped with Betadine solution and sterilely draped.  He was given 1  g of Ancef at the initiation of the procedure.  A right inguinal incision  was made with sharp dissection and carried down to the external oblique  fascia.  The patient had a very patulous external ring, which was  chronically dilated from his hernia.  I opened the external oblique fascia,  encircled the cord structures with a  Penrose drain, and along the  anteromedial surface he had a sac which was probably 12-14 cm long.  It was  fatty.  I was able to reduce the bowel out of the sac into the peritoneal  cavity, lift the sac up.  There was no evidence of a sliding component.  I  then twisted the sac and ligated it with two #1 chromic sutures and then  reduced it back into the internal ring.   I then skeletonized the cord structures and then made sure there was no  hernia sac left behind.  I checked the inguinal floor.  He really had no  component of a direct hernia though his internal ring was stretched, which  affected the lateral edge of the inguinal floor.   I then got a piece of Marlex mesh, which was 3 x 6 inches, cut it down to  about 2-1/2 x 5 inches, into the shape of the inguinal floor.   I sewed this into place medially to the pubic tubercle, inferiorly to the  shelving  edge of the inguinal ligament, superiorly to the transversalis  fascia.  I left a keyhole which was about 2 cm in diameter, which easily  admitted a finger, and allowed the cord structures to go through it, and I  closed the keyhole to recreate the internal ring.   I used 0 Novofil suture to sew the mesh in place.  I then returned to the  cord structures to their normal location and closed the external oblique  fascia with interrupted 3-0 Vicryl sutures.  I tried to make the external  ring a little bit tighter but it was really kind of hard because of how  thinned-out the external oblique fascia was.   I then infiltrated the fascial spaces with 0.25% Marcaine using a total of  about 30 mL.  I then closed the subcutaneous tissues with 3-0 Vicryl  sutures, the skin with a 5-0 Vicryl, painted the wound with tincture of  Benzoin and steri-stripped it.   He tolerated the procedure well and was transported to the recovery room in  good condition.  Sponge and needle count were correct at the end of the  case.      Sandria Bales. Ezzard Standing,  M.D.  Electronically Signed     DHN/MEDQ  D:  05/27/2005  T:  05/29/2005  Job:  [id number]   cc:   Donia Guiles, M.D.  Fax: [phone]   Darlin Priestly, MD  Fax: [phone]   Danise Edge, M.D.  Fax: [phone]

## 2010-07-17 NOTE — Discharge Summary (Signed)
NAMESILUS, LANZO               ACCOUNT NO.:  [id number]   MEDICAL RECORD NO.:  [id number]          PATIENT TYPE:  INP   LOCATION:  5015                         FACILITY:  [facility]   PHYSICIAN:  Robert A. Thurston Hole, M.D. DATE OF BIRTH:  12-28-1940   DATE OF ADMISSION:  08/26/2004  DATE OF DISCHARGE:                                 DISCHARGE SUMMARY   ADDENDUM:   DISCHARGE MEDICATIONS:  He will need Keflex 500 mg one p.o. q.i.d. for 10  days.  He will also need Colace 100 mg one p.o. b.i.d., Senokot one tablet  b.i.d. half hour a.c.  He is being discharged to a skilled nursing facility  in stable condition.       KS/MEDQ  D:  09/04/2004  T:  09/04/2004  Job:  [id number]

## 2010-07-17 NOTE — Cardiovascular Report (Signed)
NAME:  Colton Montgomery, Colton Montgomery                         ACCOUNT NO.:  [id number]   MEDICAL RECORD NO.:  [id number]                   PATIENT TYPE:  AMB   LOCATION:  ENDO                                 FACILITY:  [facility]   PHYSICIAN:  Darlin Priestly, M.D.             DATE OF BIRTH:  05/16/1940   DATE OF PROCEDURE:  10/10/2003  DATE OF DISCHARGE:                              CARDIAC CATHETERIZATION   PROCEDURES:  Transesophageal echocardiogram-guided direct current  cardioversion.   ATTENDING PHYSICIAN:  Darlin Priestly, M.D.   COMPLICATIONS:  None.   INDICATIONS:  [name] is a 70 year old male with a history of atrial  fibrillation noted at time of knee surgery.  The patient is to undergo  cardiac catheterization with a non-critical CAD with ejection fraction of 20  to 25%.  He has been on chronic Coumadin therapy since recognition of his  atrial fibrillation with rate control.  He is now brought in for attempted  DC cardioversion.   DESCRIPTION OF PROCEDURE:  After obtaining informed consent, the patient was  brought to the endoscopy suite in fasting state.  The patient then underwent  successful and uncomplicated transesophageal echocardiogram.   1. The LV appeared to be moderately dilated with moderately to severely     depressed LV systolic function, estimated ejection fraction 20 to 25%,     with global hypokinesis.  2. There was mild thickening of the aortic valve with trivial aortic     regurgitation.  3. Mildly thickened mitral valve leaflets with mild prolapse and anterior     mitral valve leaflet.  There was mild mitral regurgitation.  4. Structurally normal tricuspid valve with mild tricuspid regurgitation.  5. There was noted to be eustachian valve in the right atrium.  6. There was no evidence of intracardiac mass or thrombus noted; however,     there was noted to be spontaneous echo contrast in the left atrium and     left atrial appendage.  7. There was noted to  be a small patent foramen ovale with left-to-right     shunting by color wave Doppler.   At the conclusion of the TEE, the patient then received general anesthesia  by Dr. Katrinka Blazing.  The patient then received one shock at 150 biphasic joules.  The patient converted to sinus rhythm which lasted approximately 1 to 2  minutes.  The patient then reverted back to atrial fibrillation at rate of  89.  He remained hemodynamically stable throughout.  The patient awoke in  satisfactory condition.   CONCLUSION:  Successful transesophageal echocardiogram-guided cardioversions  with findings as noted above.  However, the patient did revert to atrial  fibrillation at the conclusion of the procedure.  Darlin Priestly, M.D.    RHM/MEDQ  D:  10/10/2003  T:  10/11/2003  Job:  [id number]

## 2011-01-08 ENCOUNTER — Other Ambulatory Visit: Payer: Self-pay | Admitting: Cardiovascular Disease

## 2011-01-08 ENCOUNTER — Ambulatory Visit
Admission: RE | Admit: 2011-01-08 | Discharge: 2011-01-08 | Disposition: A | Discharge status: Home / Self Care | Payer: BC Managed Care – PPO | Source: Ambulatory Visit | Attending: Cardiovascular Disease | Admitting: Cardiovascular Disease

## 2011-01-08 DIAGNOSIS — I4891 Unspecified atrial fibrillation: Secondary | ICD-10-CM

## 2011-01-15 ENCOUNTER — Encounter (HOSPITAL_COMMUNITY): Payer: Self-pay | Admitting: Pharmacy Technician

## 2011-01-20 ENCOUNTER — Encounter (HOSPITAL_COMMUNITY)
Admission: RE | Disposition: A | Discharge status: Home / Self Care | Payer: Self-pay | Source: Ambulatory Visit | Attending: Cardiovascular Disease

## 2011-01-20 ENCOUNTER — Ambulatory Visit (HOSPITAL_COMMUNITY)
Admission: RE | Admit: 2011-01-20 | Discharge: 2011-01-20 | Disposition: A | Discharge status: Home / Self Care | Payer: BC Managed Care – PPO | Source: Ambulatory Visit | Attending: Cardiovascular Disease | Admitting: Cardiovascular Disease

## 2011-01-20 DIAGNOSIS — Z8679 Personal history of other diseases of the circulatory system: Secondary | ICD-10-CM

## 2011-01-20 DIAGNOSIS — Z9889 Other specified postprocedural states: Secondary | ICD-10-CM

## 2011-01-20 DIAGNOSIS — Z4509 Encounter for adjustment and management of other cardiac device: Secondary | ICD-10-CM | POA: Insufficient documentation

## 2011-01-20 HISTORY — PX: LOOP RECORDER EXPLANT: SHX5476

## 2011-01-20 SURGERY — LOOP RECORDER EXPLANT
Anesthesia: LOCAL

## 2011-01-20 MED ORDER — MUPIROCIN 2 % EX OINT: TOPICAL_OINTMENT | Freq: Two times a day (BID) | CUTANEOUS | Status: DC

## 2011-01-20 MED ORDER — CEFAZOLIN SODIUM 1-5 GM-% IV SOLN
1.0000 g | INTRAVENOUS | Status: AC
  Administered 2011-01-20: 1 g via INTRAVENOUS
  Filled 2011-01-20 (×2): qty 50

## 2011-01-20 MED ORDER — HEPARIN (PORCINE) IN NACL 2-0.9 UNIT/ML-% IJ SOLN
INTRAMUSCULAR | Status: AC
  Filled 2011-01-20: qty 1000

## 2011-01-20 MED ORDER — LIDOCAINE HCL (PF) 1 % IJ SOLN
INTRAMUSCULAR | Status: AC
  Filled 2011-01-20: qty 60

## 2011-01-20 MED ORDER — SODIUM CHLORIDE 0.9 % IR SOLN
80.0000 mg | Status: AC
  Administered 2011-01-20: 80 mg
  Filled 2011-01-20 (×2): qty 2

## 2011-01-20 MED ORDER — SODIUM CHLORIDE 0.9 % IV SOLN
INTRAVENOUS | Status: DC
  Administered 2011-01-20: 08:00:00 via INTRAVENOUS

## 2011-01-20 NOTE — Op Note (Signed)
Procedure performed:  Plantation of implantable loop recorder  Reason for procedure:  Device no longer needed  Procedure performed by [facility] MD  Estimated blood loss less than 5 mL  Complications none  The patient has successfully undergone ablation of atrial fibrillation without any recurrence after several months of followup. The risks and benefits of loop recorder explantation discussed. Informed consent was provided. The patient further cardiac In the fasting state. He was prepped and draped usual sterile fashion. Local anesthesia with 50 mL of 1% lidocaine was administered to the area of the previous surgical scar. A 3 cm incision was made at the level of the previous scar. Using electrocautery and sharp and blunt dissection the pocket of the loop recorder was exposed the device was explanted. The pocket was then flushed with copious amounts of antibiotic solution and inspected for hemostasis of the centimeter excellent. The incision was then closed with 2 layers of 2-0 Vicryl after which Steri-Strips and a pressure dressing were applied.  No sedation was administered for the procedure. The patient was discharged home later today.

## 2011-01-20 NOTE — H&P (Signed)
   History reviewed, patient examined, no change in status, stable for surgery.  

## 2011-02-02 ENCOUNTER — Other Ambulatory Visit: Payer: Self-pay | Admitting: Dermatology

## 2011-09-15 ENCOUNTER — Other Ambulatory Visit: Payer: Self-pay | Admitting: Urology

## 2011-09-17 ENCOUNTER — Encounter (HOSPITAL_BASED_OUTPATIENT_CLINIC_OR_DEPARTMENT_OTHER): Payer: Self-pay | Admitting: *Deleted

## 2011-09-17 NOTE — Progress Notes (Addendum)
NPO AFTER MN. ARRIVES AT 0715. NEEDS ISTAT. CURRENT EKG, LAST OFFICE NOTE, STRESS TEST AND ECHO TO BE FAXED FROM DR [id number] ([phone]). WILL TAKE COREG, ZOCOR, AND SYNTHROID W/ SIPS OF WATER.   REVIEWED CHART W/ DR SCHUSTER MDA, (INCLUDING PREVIOUS ANES. RECORD AND NOTE FROM LAST SURG. 2011) OK TO PROCEED. AWAITING LAST OFFICE NOTE EKG TO BE FAXED FROM DR [id number]. DR Shireen Quan REQUESTED FOR EKG TO BE REPEATED DOS.

## 2011-09-20 ENCOUNTER — Encounter (HOSPITAL_BASED_OUTPATIENT_CLINIC_OR_DEPARTMENT_OTHER): Payer: Self-pay | Admitting: *Deleted

## 2011-09-20 NOTE — H&P (Signed)
ive Problems Problems  1. Benign Prostatic Hypertrophy With Urinary Obstruction 600.01 2. Gross Hematuria 599.71 3. Hydronephrosis On The Right 591 4. IVP - Filling Defect  History of Present Illness  Colton Montgomery is a 71 yo WM sent in consultation by Dr. Kimberly Shaw for gross hematuria.  He had the onset on July 5th of gross hematuria without dysuria.  He has some frequency and nocturia.   He had a UA that was Nit+ with red and white cells.  He had a negative culture but was given cipro 500mg for 7 days.  He took the last Monday and had no improvement in the hematuria.  His UA is Nit+ today with TNTC RBC's with many bacteria but only 0-2 WBC's.   He has a history of a prior stone and gross hematuria in 2006.  He may have some LLQ discomfort but it is not severe.  He has no flank pain.   Past Medical History Problems  1. History of  Arthritis V13.4 2. History of  Heart Disease 429.9 3. History of  Hypercholesterolemia 272.0 4. History of  Nephrolithiasis V13.01  Surgical History Problems  1. History of  Catheter Ablation 2. History of  Hernia Repair 3. History of  Knee Surgery 4. History of  Throat Surgery  Current Meds 1. Acyclovir 400 MG Oral Tablet; Therapy: 11Jul2013 to 2. Aspirin 81 MG Oral Tablet; Therapy: (Recorded:17Jul2013) to 3. Carvedilol TABS; Therapy: (Recorded:17Jul2013) to 4. Oxycodone-Acetaminophen 5-325 MG Oral Tablet; Therapy: 03Apr2013 to 5. Ramipril CAPS; Therapy: (Recorded:17Jul2013) to 6. Simvastatin TABS; Therapy: (Recorded:17Jul2013) to  Allergies Medication  1. Tetracyclines  Family History Problems  1. Paternal history of  Alzheimer's Disease 2. Family history of  Death In The Family Father 3. Family history of  Death In The Family Mother 4. Family history of  Family Health Status Number Of Children  Social History Problems    Alcohol Use   Caffeine Use   Former Smoker V15.82   Marital History - Divorced V61.03   Occupation: Denied     History of  Tobacco Use 305.1  Review of Systems Genitourinary, constitutional, skin, eye, otolaryngeal, hematologic/lymphatic, cardiovascular, pulmonary, endocrine, musculoskeletal, gastrointestinal, neurological and psychiatric system(s) were reviewed and pertinent findings if present are noted.  Genitourinary: urinary frequency, feelings of urinary urgency, nocturia, difficulty starting the urinary stream, weak urinary stream and erectile dysfunction (He gets erections just not as often).  Musculoskeletal: joint pain.    Vitals Vital Signs [Data Includes: Last 1 Day]  17Jul2013 11:31AM  Heart Rate: 65 17Jul2013 11:26AM  BMI Calculated: 22.82 BSA Calculated: 1.93 Height: 5 ft 11 in Weight: 163 lb  Blood Pressure: 98 / 63 Temperature: 97.8 F Heart Rate: 36  Physical Exam Constitutional: Well nourished and well developed . No acute distress.  ENT:. The ears and nose are normal in appearance.  Neck: The appearance of the neck is normal and no neck mass is present.  Pulmonary: No respiratory distress and normal respiratory rhythm and effort.  Cardiovascular: Heart rate and rhythm are normal . No peripheral edema.  Abdomen: The abdomen is soft and nontender. No masses are palpated. No CVA tenderness. No hernias are palpable. No hepatosplenomegaly noted.  Rectal: Rectal exam demonstrates normal sphincter tone, no tenderness and no masses. Estimated prostate size is 2+. The prostate has no nodularity and is not tender. The left seminal vesicle is nonpalpable. The right seminal vesicle is nonpalpable. The perineum is normal on inspection.  Genitourinary: Examination of the penis demonstrates no discharge, no   masses, no lesions and a normal meatus. The scrotum is without lesions. The right epididymis is palpably normal and non-tender. The left epididymis is palpably normal and non-tender. The right testis is non-tender and without masses. The left testis is non-tender and without masses.    Lymphatics: The femoral and inguinal nodes are not enlarged or tender.  Skin: Normal skin turgor, no visible rash and no visible skin lesions.  Neuro/Psych:. Mood and affect are appropriate.    Results/Data Urine [Data Includes: Last 1 Day]   17Jul2013  COLOR RED   APPEARANCE CLOUDY   SPECIFIC GRAVITY 1.030   pH 6.5   GLUCOSE NEG mg/dL  BILIRUBIN MOD   KETONE NEG mg/dL  BLOOD LARGE   PROTEIN > 300 mg/dL  UROBILINOGEN 1 mg/dL  NITRITE POS   LEUKOCYTE ESTERASE TRACE   SQUAMOUS EPITHELIAL/HPF NONE SEEN   WBC 0-2 WBC/hpf  RBC TNTC RBC/hpf  BACTERIA MANY   CRYSTALS NONE SEEN   CASTS NONE SEEN    Old records or history reviewed: I have reviewed records and labs from Dr. Shaw. I have reviewed our prior office records.  The following images/tracing/specimen were independently visualized:  CT hematuria study shows right hydro to the UPJ with a small filling defect in the pelvis that could be a clot or a tumor. He has small right renal cysts. He has a large prostate with a middle lobe and an anterior diverticulum. A full report is pending.    Assessment Assessed  1. Gross Hematuria 599.71 2. Hydronephrosis On The Right 591 3. IVP - Filling Defect 4. Benign Prostatic Hypertrophy With Urinary Obstruction 600.01   He has gross hematuria with a negative culture and right hydro with a filling defect. He has BPH with BOO and a diverticulum.   Plan Gross Hematuria (599.71)  1. AU CT-HEMATURIA PROTOCOL  Done: 17Jul2013 12:00AM 2. URINE CULTURE  Done: 17Jul2013 3. URINE CYTOLOGY  Requested for: 17Jul2013 Health Maintenance (V70.0)  4. UA With REFLEX  Done: 17Jul2013 11:15AM Hydronephrosis On The Right (591)  5. Follow-up Schedule Surgery Office  Follow-up  Requested for: 17Jul2013   I am going to set him up for cystoscopy with right RTG pyelogram, right ureteroscopy and stenting. I have reviewed the risks of bleeding, infection, ureteral injury, need for stent or second procedures,  thrombotic events and anesthetic complications. I am going to reculture his urine and also get a cytology today. 

## 2011-09-21 ENCOUNTER — Encounter (HOSPITAL_BASED_OUTPATIENT_CLINIC_OR_DEPARTMENT_OTHER): Payer: Self-pay | Admitting: Anesthesiology

## 2011-09-21 ENCOUNTER — Ambulatory Visit (HOSPITAL_BASED_OUTPATIENT_CLINIC_OR_DEPARTMENT_OTHER): Payer: BC Managed Care – PPO | Admitting: Anesthesiology

## 2011-09-21 ENCOUNTER — Other Ambulatory Visit: Payer: Self-pay | Admitting: Urology

## 2011-09-21 ENCOUNTER — Encounter (HOSPITAL_BASED_OUTPATIENT_CLINIC_OR_DEPARTMENT_OTHER): Payer: Self-pay | Admitting: *Deleted

## 2011-09-21 ENCOUNTER — Encounter (HOSPITAL_BASED_OUTPATIENT_CLINIC_OR_DEPARTMENT_OTHER)
Admission: RE | Disposition: A | Discharge status: Home / Self Care | Payer: Self-pay | Source: Ambulatory Visit | Attending: Urology

## 2011-09-21 ENCOUNTER — Ambulatory Visit (HOSPITAL_BASED_OUTPATIENT_CLINIC_OR_DEPARTMENT_OTHER)
Admission: RE | Admit: 2011-09-21 | Discharge: 2011-09-21 | Disposition: A | Discharge status: Home / Self Care | Payer: BC Managed Care – PPO | Source: Ambulatory Visit | Attending: Urology | Admitting: Urology

## 2011-09-21 DIAGNOSIS — N3289 Other specified disorders of bladder: Secondary | ICD-10-CM | POA: Insufficient documentation

## 2011-09-21 DIAGNOSIS — R9389 Abnormal findings on diagnostic imaging of other specified body structures: Secondary | ICD-10-CM | POA: Insufficient documentation

## 2011-09-21 DIAGNOSIS — R310 Gross hematuria: Secondary | ICD-10-CM | POA: Insufficient documentation

## 2011-09-21 DIAGNOSIS — Z79899 Other long term (current) drug therapy: Secondary | ICD-10-CM | POA: Insufficient documentation

## 2011-09-21 DIAGNOSIS — N401 Enlarged prostate with lower urinary tract symptoms: Secondary | ICD-10-CM | POA: Insufficient documentation

## 2011-09-21 DIAGNOSIS — Z7982 Long term (current) use of aspirin: Secondary | ICD-10-CM | POA: Insufficient documentation

## 2011-09-21 DIAGNOSIS — R82998 Other abnormal findings in urine: Secondary | ICD-10-CM | POA: Insufficient documentation

## 2011-09-21 DIAGNOSIS — N1330 Unspecified hydronephrosis: Secondary | ICD-10-CM | POA: Insufficient documentation

## 2011-09-21 DIAGNOSIS — E7800 Pure hypercholesterolemia, unspecified: Secondary | ICD-10-CM | POA: Insufficient documentation

## 2011-09-21 DIAGNOSIS — N138 Other obstructive and reflux uropathy: Secondary | ICD-10-CM | POA: Insufficient documentation

## 2011-09-21 HISTORY — DX: Other cardiomyopathies: I42.8

## 2011-09-21 HISTORY — DX: Dysphonia: R49.0

## 2011-09-21 HISTORY — PX: CYSTOSCOPY W/ URETERAL STENT PLACEMENT: SHX1429

## 2011-09-21 HISTORY — DX: Atherosclerotic heart disease of native coronary artery without angina pectoris: I25.10

## 2011-09-21 HISTORY — DX: Unspecified osteoarthritis, unspecified site: M19.90

## 2011-09-21 HISTORY — DX: Hypothyroidism, unspecified: E03.9

## 2011-09-21 HISTORY — DX: Essential (primary) hypertension: I10

## 2011-09-21 HISTORY — DX: Unspecified hydronephrosis: N13.30

## 2011-09-21 HISTORY — DX: Personal history of other diseases of the circulatory system: Z86.79

## 2011-09-21 HISTORY — DX: Hyperlipidemia, unspecified: E78.5

## 2011-09-21 HISTORY — DX: Other specified postprocedural states: Z98.890

## 2011-09-21 LAB — POCT I-STAT 4, (NA,K, GLUC, HGB,HCT)
Glucose, Bld: 98 mg/dL (ref 70–99)
Glucose, Bld: 99 mg/dL (ref 70–99)
HCT: 41 % (ref 39.0–52.0)
HCT: 42 % (ref 39.0–52.0)
Hemoglobin: 13.9 g/dL (ref 13.0–17.0)
Hemoglobin: 14.3 g/dL (ref 13.0–17.0)
Potassium: 3.9 mEq/L (ref 3.5–5.1)
Potassium: >9 mEq/L (ref 3.5–5.1)
Sodium: 141 mEq/L (ref 135–145)
Sodium: 135 mEq/L (ref 135–145)

## 2011-09-21 SURGERY — CYSTOSCOPY, WITH RETROGRADE PYELOGRAM AND URETERAL STENT INSERTION
Anesthesia: General | Site: Ureter | Laterality: Right | Wound class: Clean Contaminated

## 2011-09-21 MED ORDER — HYOSCYAMINE SULFATE 0.125 MG SL SUBL: 0.1250 mg | SUBLINGUAL_TABLET | SUBLINGUAL | Status: DC | PRN

## 2011-09-21 MED ORDER — CIPROFLOXACIN IN D5W 400 MG/200ML IV SOLN
400.0000 mg | INTRAVENOUS | Status: AC
  Administered 2011-09-21: 400 mg via INTRAVENOUS

## 2011-09-21 MED ORDER — HYOSCYAMINE SULFATE 0.125 MG SL SUBL
0.1250 mg | SUBLINGUAL_TABLET | SUBLINGUAL | Status: DC | PRN
  Administered 2011-09-21: 0.125 mg via SUBLINGUAL

## 2011-09-21 MED ORDER — IOHEXOL 350 MG/ML SOLN
INTRAVENOUS | Status: DC | PRN
  Administered 2011-09-21: 50 mL via INTRAVENOUS

## 2011-09-21 MED ORDER — FENTANYL CITRATE 0.05 MG/ML IJ SOLN: 25.0000 ug | INTRAMUSCULAR | Status: DC | PRN

## 2011-09-21 MED ORDER — ACETAMINOPHEN 650 MG RE SUPP: 650.0000 mg | RECTAL | Status: DC | PRN

## 2011-09-21 MED ORDER — EPHEDRINE SULFATE 50 MG/ML IJ SOLN
INTRAMUSCULAR | Status: DC | PRN
  Administered 2011-09-21: 10 mg via INTRAVENOUS

## 2011-09-21 MED ORDER — PROPOFOL 10 MG/ML IV EMUL
INTRAVENOUS | Status: DC | PRN
  Administered 2011-09-21: 275 mg via INTRAVENOUS

## 2011-09-21 MED ORDER — ONDANSETRON HCL 4 MG/2ML IJ SOLN
INTRAMUSCULAR | Status: DC | PRN
  Administered 2011-09-21: 4 mg via INTRAVENOUS

## 2011-09-21 MED ORDER — OXYCODONE HCL 5 MG PO TABS: 5.0000 mg | ORAL_TABLET | ORAL | Status: DC | PRN

## 2011-09-21 MED ORDER — PHENAZOPYRIDINE HCL 200 MG PO TABS
200.0000 mg | ORAL_TABLET | Freq: Three times a day (TID) | ORAL | Status: DC | PRN
  Administered 2011-09-21: 200 mg via ORAL

## 2011-09-21 MED ORDER — CEFAZOLIN SODIUM 1-5 GM-% IV SOLN: 1.0000 g | INTRAVENOUS | Status: DC

## 2011-09-21 MED ORDER — BELLADONNA ALKALOIDS-OPIUM 16.2-60 MG RE SUPP
RECTAL | Status: DC | PRN
  Administered 2011-09-21: 1 via RECTAL

## 2011-09-21 MED ORDER — PROMETHAZINE HCL 25 MG/ML IJ SOLN: 6.2500 mg | INTRAMUSCULAR | Status: DC | PRN

## 2011-09-21 MED ORDER — TAMSULOSIN HCL 0.4 MG PO CAPS
0.4000 mg | ORAL_CAPSULE | Freq: Every day | ORAL | Status: DC
  Administered 2011-09-21: 0.4 mg via ORAL

## 2011-09-21 MED ORDER — LACTATED RINGERS IV SOLN
INTRAVENOUS | Status: DC
  Administered 2011-09-21: 12:00:00 via INTRAVENOUS

## 2011-09-21 MED ORDER — ACETAMINOPHEN 325 MG PO TABS: 650.0000 mg | ORAL_TABLET | ORAL | Status: DC | PRN

## 2011-09-21 MED ORDER — DEXAMETHASONE SODIUM PHOSPHATE 4 MG/ML IJ SOLN
INTRAMUSCULAR | Status: DC | PRN
  Administered 2011-09-21: 10 mg via INTRAVENOUS

## 2011-09-21 MED ORDER — SODIUM CHLORIDE 0.9 % IR SOLN
Status: DC | PRN
  Administered 2011-09-21: 1 via INTRAVESICAL

## 2011-09-21 MED ORDER — PHENAZOPYRIDINE HCL 200 MG PO TABS: 200.0000 mg | ORAL_TABLET | Freq: Three times a day (TID) | ORAL | Status: AC | PRN

## 2011-09-21 MED ORDER — SODIUM CHLORIDE 0.9 % IV SOLN: 250.0000 mL | INTRAVENOUS | Status: DC | PRN

## 2011-09-21 MED ORDER — SODIUM CHLORIDE 0.9 % IJ SOLN: 3.0000 mL | INTRAMUSCULAR | Status: DC | PRN

## 2011-09-21 MED ORDER — SODIUM CHLORIDE 0.9 % IJ SOLN: 3.0000 mL | Freq: Two times a day (BID) | INTRAMUSCULAR | Status: DC

## 2011-09-21 MED ORDER — HYDROMORPHONE HCL PF 1 MG/ML IJ SOLN: 0.2500 mg | INTRAMUSCULAR | Status: DC | PRN

## 2011-09-21 MED ORDER — OXYCODONE-ACETAMINOPHEN 5-325 MG PO TABS: 1.0000 | ORAL_TABLET | ORAL | Status: AC | PRN

## 2011-09-21 MED ORDER — TAMSULOSIN HCL 0.4 MG PO CAPS: 0.4000 mg | ORAL_CAPSULE | Freq: Every day | ORAL | Status: DC

## 2011-09-21 MED ORDER — LACTATED RINGERS IV SOLN
INTRAVENOUS | Status: DC
  Administered 2011-09-21 (×3): via INTRAVENOUS

## 2011-09-21 MED ORDER — LIDOCAINE HCL (CARDIAC) 20 MG/ML IV SOLN
INTRAVENOUS | Status: DC | PRN
  Administered 2011-09-21: 80 mg via INTRAVENOUS

## 2011-09-21 MED ORDER — ONDANSETRON HCL 4 MG/2ML IJ SOLN: 4.0000 mg | Freq: Four times a day (QID) | INTRAMUSCULAR | Status: DC | PRN

## 2011-09-21 MED ORDER — FENTANYL CITRATE 0.05 MG/ML IJ SOLN
INTRAMUSCULAR | Status: DC | PRN
  Administered 2011-09-21 (×2): 25 ug via INTRAVENOUS
  Administered 2011-09-21: 50 ug via INTRAVENOUS

## 2011-09-21 SURGICAL SUPPLY — 14 items
BAG DRAIN URO-CYSTO SKYTR STRL (DRAIN) ×3 IMPLANT
CANISTER SUCT LVC 12 LTR MEDI- (MISCELLANEOUS) ×3 IMPLANT
CATH URET 5FR 28IN OPEN ENDED (CATHETERS) ×3 IMPLANT
CLOTH BEACON ORANGE TIMEOUT ST (SAFETY) ×3 IMPLANT
CONT SPEC 4OZ CLIKSEAL STRL BL (MISCELLANEOUS) ×3 IMPLANT
DRAPE CAMERA CLOSED 9X96 (DRAPES) ×3 IMPLANT
GLOVE INDICATOR 6.5 STRL GRN (GLOVE) ×6 IMPLANT
GLOVE SURG SS PI 8.0 STRL IVOR (GLOVE) ×3 IMPLANT
GOWN PREVENTION PLUS LG XLONG (DISPOSABLE) ×3 IMPLANT
GOWN STRL REIN XL XLG (GOWN DISPOSABLE) ×3 IMPLANT
GUIDEWIRE STR DUAL SENSOR (WIRE) ×3 IMPLANT
IV NS IRRIG 3000ML ARTHROMATIC (IV SOLUTION) ×3 IMPLANT
PACK CYSTOSCOPY (CUSTOM PROCEDURE TRAY) ×3 IMPLANT
STENT URET 6FRX26 CONTOUR (STENTS) ×3 IMPLANT

## 2011-09-21 NOTE — Interval H&P Note (Signed)
History and Physical Interval Note:  09/21/2011 8:32 AM  Colton Montgomery  has presented today for surgery, with the diagnosis of right hydronephrosis, hematuria with filling defect   The various methods of treatment have been discussed with the patient and family. After consideration of risks, benefits and other options for treatment, the patient has consented to  Procedure(s) (LRB): CYSTOSCOPY WITH RETROGRADE PYELOGRAM, URETEROSCOPY AND STENT PLACEMENT (N/A) as a surgical intervention .  The patient's history has been reviewed, patient examined, no change in status, stable for surgery.  I have reviewed the patient's chart and labs.  Questions were answered to the patient's satisfaction.     [name]

## 2011-09-21 NOTE — Anesthesia Procedure Notes (Signed)
Procedure Name: LMA Insertion Date/Time: 09/21/2011 8:35 AM Performed by: Fran Lowes Pre-anesthesia Checklist: Patient identified, Emergency Drugs available, Suction available and Patient being monitored Patient Re-evaluated:Patient Re-evaluated prior to inductionOxygen Delivery Method: Circle System Utilized Preoxygenation: Pre-oxygenation with 100% oxygen Intubation Type: IV induction Ventilation: Mask ventilation without difficulty LMA: LMA inserted LMA Size: 4.0 Number of attempts: 1 Airway Equipment and Method: bite block Placement Confirmation: positive ETCO2 Tube secured with: Tape Dental Injury: Teeth and Oropharynx as per pre-operative assessment

## 2011-09-21 NOTE — Progress Notes (Addendum)
I&O cath done by D. Burleson RN,w # 16 coude fr foley. 350 cc's obtained.  Pt tolerated procedure well.  Dr. Annabell Howells called and informed of void x 3 100,125,100 since 12n.  Bladder scanner showed 350.  Order obtained to d/c to home w cath if pt agreeable.  Pt informed. Pt agreeable to cath w d/c in am.

## 2011-09-21 NOTE — Anesthesia Preprocedure Evaluation (Addendum)
Anesthesia Evaluation  Patient identified by MRN, date of birth, ID band Patient awake    Reviewed: Allergy & Precautions, H&P , NPO status , Patient's Chart, lab work & pertinent test results  Airway Mallampati: II TM Distance: >3 FB Neck ROM: Full    Dental  (+) Teeth Intact, Caps and Dental Advisory Given   Pulmonary former smoker,  Hoarseness, chronic secondary to laryngeal injury. breath sounds clear to auscultation  Pulmonary exam normal       Cardiovascular hypertension, + CAD + dysrhythmias (S/P ablation procedure) Atrial Fibrillation Rhythm:Regular Rate:Normal     Neuro/Psych negative neurological ROS  negative psych ROS   GI/Hepatic negative GI ROS, Neg liver ROS,   Endo/Other  negative endocrine ROSHypothyroidism   Renal/GU negative Renal ROS  negative genitourinary   Musculoskeletal negative musculoskeletal ROS (+)   Abdominal   Peds  Hematology negative hematology ROS (+)   Anesthesia Other Findings   Reproductive/Obstetrics negative OB ROS                          Anesthesia Physical Anesthesia Plan  ASA: II  Anesthesia Plan: General   Post-op Pain Management:    Induction: Intravenous  Airway Management Planned: LMA  Additional Equipment:   Intra-op Plan:   Post-operative Plan: Extubation in OR  Informed Consent: I have reviewed the patients History and Physical, chart, labs and discussed the procedure including the risks, benefits and alternatives for the proposed anesthesia with the patient or authorized representative who has indicated his/her understanding and acceptance.   Dental advisory given  Plan Discussed with: CRNA  Anesthesia Plan Comments:         Anesthesia Quick Evaluation

## 2011-09-21 NOTE — Op Note (Signed)
Colton Montgomery, Colton Montgomery               ACCOUNT NO.:  [id number]  MEDICAL RECORD NO.:  [id number]  LOCATION:  PERIOP                        FACILITY:  [facility]  PHYSICIAN:  Excell Seltzer. Annabell Howells, M.D.    DATE OF BIRTH:  23-Sep-1940  DATE OF PROCEDURE:  09/21/2011 DATE OF DISCHARGE:                              OPERATIVE REPORT   PROCEDURE:  Cystoscopy right retrograde pyelogram.  Collection of right renal pelvic urine, placement of right double-J stent.  PREOPERATIVE DIAGNOSIS:  Gross hematuria with right renal filling defect with hydronephrosis.  POSTOPERATIVE DIAGNOSIS:  Gross hematuria with right renal filling defect with hydronephrosis with probable right urethropelvic junction obstruction and clots in the renal pelvis.  SURGEON:  Excell Seltzer. Annabell Howells, MD  ANESTHESIA:  General.  SPECIMEN:  Aspiration cytology from the right renal pelvis.  DRAINS:  Six-French 26 cm, right double-J stent.  COMPLICATIONS:  None.  INDICATIONS:  Colton Montgomery is a 71 year old white male, who presented with gross hematuria.  CT scan demonstrated hydronephrosis on the right to the level of the UPJ with a filling defect in the collecting system, worrisome for clot or tumor.  He had a cytology that had atypia.  He is to undergo cysto retrograde pyelography, attempted ureteroscopy, and stent placement.  FINDINGS OF PROCEDURE:  He was taken to operating room, where he was given Cipro.  A general anesthetic was induced.  He was placed in lithotomy position.  The B and O suppository was placed.  He was prepped with Betadine solution.  He was fitted with PAS hose.  Prepped with Betadine solution and draped in usual sterile fashion.  Cystoscopy was performed using a 22-French scope and 12 and 70 degree lenses.  Examination revealed a normal urethra.  The external sphincter was intact.  The prostatic urethra was approximately 3 cm in length with trilobar hyperplasia with a prominent middle lobe.  Examination of the  bladder revealed moderate trabeculation with a 1 cm diverticulum on the posterior wall.  No mucosal lesions or stones were identified.  The ureteral orifices were unremarkable.  The right ureteral orifice was initially attempted to be cannulated with a 5-French open-end catheter but due to the angle and the tightness of the orifice that was not successful.  I then passed a guidewire through the orifice and was able to advance the open-end catheter over the guidewire.  The guidewire was removed.  Contrast was instilled.  The right retrograde pyelogram demonstrated a normal ureter to the UPJ where there appeared to be a UPJ obstruction at the level of the prominent pelvis, the internal collecting system had a dilated pelvis was some caliceal dilation but fairly crisp caliceal edges, no obvious filling defect was identified.  After the retrograde pyelogram, urine was aspirated from the right renal pelvis.  Some clots did return and the aspirate of the urine was tea-colored.  This was sent for cytological evaluation.  The guidewire was then passed to the kidney and the open-end catheter was removed along with the cystoscope.  An attempt was made to pass a 54 cm 12-French introducer sheath dilator inner core to dilate the ureter but it would not pass the ureteral meatus.  It was felt  that due to the delicate nature of this patient's ureter, the most appropriate course of action would be to leave a stent and come back at a later date for ureteroscopy.  The cystoscope was then reinserted over the wire and a 6-French 26 cm double-J stent with no string was inserted over the wire to the kidney under fluoroscopic guidance.  The wire was removed leaving good coil in the kidney and good coil in the bladder.  The patient's bladder was drained.  The cystoscope was removed.  He was taken down from lithotomy position.  His anesthetic was reversed.  He was moved to recovery in stable condition.   There were no complications.     Excell Seltzer. Annabell Howells, M.D.     JJW/MEDQ  D:  09/21/2011  T:  09/21/2011  Job:  [id number]

## 2011-09-21 NOTE — Progress Notes (Signed)
Dr. Annabell Howells in and reviewed findings and instructions.

## 2011-09-21 NOTE — Brief Op Note (Signed)
09/21/2011  9:00 AM  PATIENT:  Colton Montgomery  71 y.o. male  PRE-OPERATIVE DIAGNOSIS:  right hydronephrosis, hematuria with filling defect   POST-OPERATIVE DIAGNOSIS:  right hydronephrosis with probable right UPJ obstruction, hematuria with filling defect   PROCEDURE:  Procedure(s) (LRB): CYSTOSCOPY WITH RETROGRADE PYELOGRAM  AND STENT PLACEMENT (N/A)  SURGEON:  Surgeon(s) and Role:    * Anner Crete, MD - Primary  PHYSICIAN ASSISTANT:   ASSISTANTS: none   ANESTHESIA:   general  EBL:  Total I/O In: 1000 [I.V.:1000] Out: -   BLOOD ADMINISTERED:none  DRAINS: 6 x 26 right JJ stent   LOCAL MEDICATIONS USED:  NONE  SPECIMEN:  Source of Specimen:  right renal pelvic aspiration  DISPOSITION OF SPECIMEN:  PATHOLOGY  COUNTS:  YES  TOURNIQUET:  * No tourniquets in log *  DICTATION: .Other Dictation: Dictation Number [id number]  PLAN OF CARE: Discharge to home after PACU  PATIENT DISPOSITION:  PACU - hemodynamically stable.   Delay start of Pharmacological VTE agent (>24hrs) due to surgical blood loss or risk of bleeding: no

## 2011-09-21 NOTE — Anesthesia Postprocedure Evaluation (Signed)
Anesthesia Post Note  Patient: Colton Montgomery  Procedure(s) Performed: Procedure(s) (LRB): CYSTOSCOPY WITH RETROGRADE PYELOGRAM/URETERAL STENT PLACEMENT (Right)  Anesthesia type: General  Patient location: PACU  Post pain: Pain level controlled  Post assessment: Post-op Vital signs reviewed  Last Vitals:  Filed Vitals:   09/21/11 0930  BP: 131/81  Pulse:   Temp: 36 C  Resp:     Post vital signs: Reviewed  Level of consciousness: sedated  Complications: No apparent anesthesia complications

## 2011-09-21 NOTE — Progress Notes (Signed)
Dr. Annabell Howells called and informed of pt voiding 25, the 50 cc's.  Bladder scan showed 350 cc's in bladder pvr.  Pt admits to some discomfort.  Order received to I&O cath. Pt to void again proir to d/c. Pt informed.

## 2011-09-21 NOTE — Transfer of Care (Signed)
Immediate Anesthesia Transfer of Care Note  Patient: Colton Montgomery  Procedure(s) Performed: Procedure(s) (LRB): CYSTOSCOPY WITH RETROGRADE PYELOGRAM/URETERAL STENT PLACEMENT (Right)  Patient Location: Patient transported to PACU with oxygen via face mask at 4 Liters / Min  Anesthesia Type: General  Level of Consciousness: awake and alert   Airway & Oxygen Therapy: Patient Spontanous Breathing and Patient connected to face mask oxygen  Post-op Assessment: Report given to PACU RN and Post -op Vital signs reviewed and stable  Post vital signs: Reviewed and stable  Dentition: Teeth and oropharynx remain in pre-op condition  Complications: No apparent anesthesia complications

## 2011-09-22 ENCOUNTER — Encounter (HOSPITAL_BASED_OUTPATIENT_CLINIC_OR_DEPARTMENT_OTHER): Payer: Self-pay | Admitting: Urology

## 2011-09-23 ENCOUNTER — Encounter (HOSPITAL_BASED_OUTPATIENT_CLINIC_OR_DEPARTMENT_OTHER): Payer: Self-pay | Admitting: *Deleted

## 2011-09-23 NOTE — Progress Notes (Signed)
NPO AFTER MN. ARRIVES AT 0730. ISTAT DONE ON 09-21-2011 (ASK MDA DOS IF NEED TO BE REPEATED). CURRENT EKG W/ CHART. WILL TAKE COREG, SYNTHROID , AND ZOCOR AM OF SURG W/ SIP OF WATER.

## 2011-09-28 ENCOUNTER — Encounter (HOSPITAL_BASED_OUTPATIENT_CLINIC_OR_DEPARTMENT_OTHER)
Admission: RE | Disposition: A | Discharge status: Home / Self Care | Payer: Self-pay | Source: Ambulatory Visit | Attending: Urology

## 2011-09-28 ENCOUNTER — Encounter (HOSPITAL_BASED_OUTPATIENT_CLINIC_OR_DEPARTMENT_OTHER): Payer: Self-pay | Admitting: Anesthesiology

## 2011-09-28 ENCOUNTER — Encounter (HOSPITAL_BASED_OUTPATIENT_CLINIC_OR_DEPARTMENT_OTHER): Payer: Self-pay | Admitting: *Deleted

## 2011-09-28 ENCOUNTER — Ambulatory Visit (HOSPITAL_BASED_OUTPATIENT_CLINIC_OR_DEPARTMENT_OTHER)
Admission: RE | Admit: 2011-09-28 | Discharge: 2011-09-28 | Disposition: A | Discharge status: Home / Self Care | Payer: BC Managed Care – PPO | Source: Ambulatory Visit | Attending: Urology | Admitting: Urology

## 2011-09-28 ENCOUNTER — Ambulatory Visit (HOSPITAL_BASED_OUTPATIENT_CLINIC_OR_DEPARTMENT_OTHER): Payer: BC Managed Care – PPO | Admitting: Anesthesiology

## 2011-09-28 DIAGNOSIS — N2889 Other specified disorders of kidney and ureter: Secondary | ICD-10-CM | POA: Insufficient documentation

## 2011-09-28 DIAGNOSIS — Z7982 Long term (current) use of aspirin: Secondary | ICD-10-CM | POA: Insufficient documentation

## 2011-09-28 DIAGNOSIS — N401 Enlarged prostate with lower urinary tract symptoms: Secondary | ICD-10-CM | POA: Insufficient documentation

## 2011-09-28 DIAGNOSIS — Z79899 Other long term (current) drug therapy: Secondary | ICD-10-CM | POA: Insufficient documentation

## 2011-09-28 DIAGNOSIS — N135 Crossing vessel and stricture of ureter without hydronephrosis: Secondary | ICD-10-CM | POA: Insufficient documentation

## 2011-09-28 DIAGNOSIS — N1330 Unspecified hydronephrosis: Secondary | ICD-10-CM | POA: Insufficient documentation

## 2011-09-28 DIAGNOSIS — E7800 Pure hypercholesterolemia, unspecified: Secondary | ICD-10-CM | POA: Insufficient documentation

## 2011-09-28 DIAGNOSIS — R310 Gross hematuria: Secondary | ICD-10-CM | POA: Insufficient documentation

## 2011-09-28 DIAGNOSIS — N138 Other obstructive and reflux uropathy: Secondary | ICD-10-CM | POA: Insufficient documentation

## 2011-09-28 HISTORY — PX: URETEROSCOPY: SHX842

## 2011-09-28 HISTORY — PX: CYSTOSCOPY W/ URETERAL STENT REMOVAL: SHX1430

## 2011-09-28 HISTORY — DX: Abnormal radiologic findings on diagnostic imaging of unspecified kidney: R93.429

## 2011-09-28 LAB — POCT I-STAT 4, (NA,K, GLUC, HGB,HCT)
Glucose, Bld: 99 mg/dL (ref 70–99)
HCT: 44 % (ref 39.0–52.0)
Hemoglobin: 15 g/dL (ref 13.0–17.0)
Potassium: 4.2 mEq/L (ref 3.5–5.1)
Sodium: 140 mEq/L (ref 135–145)

## 2011-09-28 SURGERY — URETEROSCOPY
Anesthesia: General | Site: Ureter | Laterality: Right

## 2011-09-28 MED ORDER — ONDANSETRON HCL 4 MG/2ML IJ SOLN: 4.0000 mg | Freq: Four times a day (QID) | INTRAMUSCULAR | Status: DC | PRN

## 2011-09-28 MED ORDER — SODIUM CHLORIDE 0.9 % IJ SOLN: 3.0000 mL | Freq: Two times a day (BID) | INTRAMUSCULAR | Status: DC

## 2011-09-28 MED ORDER — SODIUM CHLORIDE 0.9 % IJ SOLN: 3.0000 mL | INTRAMUSCULAR | Status: DC | PRN

## 2011-09-28 MED ORDER — DEXAMETHASONE SODIUM PHOSPHATE 4 MG/ML IJ SOLN
INTRAMUSCULAR | Status: DC | PRN
  Administered 2011-09-28: 10 mg via INTRAVENOUS

## 2011-09-28 MED ORDER — GLYCINE 1.5 % IR SOLN
Status: DC | PRN
  Administered 2011-09-28: 3000 mL

## 2011-09-28 MED ORDER — FENTANYL CITRATE 0.05 MG/ML IJ SOLN: 25.0000 ug | INTRAMUSCULAR | Status: DC | PRN

## 2011-09-28 MED ORDER — LIDOCAINE HCL (CARDIAC) 20 MG/ML IV SOLN
INTRAVENOUS | Status: DC | PRN
  Administered 2011-09-28: 50 mg via INTRAVENOUS

## 2011-09-28 MED ORDER — LACTATED RINGERS IV SOLN
INTRAVENOUS | Status: DC
  Administered 2011-09-28 (×2): via INTRAVENOUS

## 2011-09-28 MED ORDER — OXYCODONE HCL 5 MG PO TABS: 5.0000 mg | ORAL_TABLET | ORAL | Status: DC | PRN

## 2011-09-28 MED ORDER — MIDAZOLAM HCL 5 MG/5ML IJ SOLN
INTRAMUSCULAR | Status: DC | PRN
  Administered 2011-09-28: 1 mg via INTRAVENOUS

## 2011-09-28 MED ORDER — CIPROFLOXACIN IN D5W 400 MG/200ML IV SOLN
400.0000 mg | INTRAVENOUS | Status: AC
  Administered 2011-09-28: 400 mg via INTRAVENOUS

## 2011-09-28 MED ORDER — ACETAMINOPHEN 325 MG PO TABS: 650.0000 mg | ORAL_TABLET | ORAL | Status: DC | PRN

## 2011-09-28 MED ORDER — ONDANSETRON HCL 4 MG/2ML IJ SOLN
INTRAMUSCULAR | Status: DC | PRN
  Administered 2011-09-28: 4 mg via INTRAVENOUS

## 2011-09-28 MED ORDER — FENTANYL CITRATE 0.05 MG/ML IJ SOLN
INTRAMUSCULAR | Status: DC | PRN
  Administered 2011-09-28: 50 ug via INTRAVENOUS
  Administered 2011-09-28 (×2): 25 ug via INTRAVENOUS

## 2011-09-28 MED ORDER — TAMSULOSIN HCL 0.4 MG PO CAPS
0.4000 mg | ORAL_CAPSULE | Freq: Every day | ORAL | Status: DC
  Administered 2011-09-28: 0.4 mg via ORAL

## 2011-09-28 MED ORDER — SODIUM CHLORIDE 0.9 % IV SOLN: 250.0000 mL | INTRAVENOUS | Status: DC | PRN

## 2011-09-28 MED ORDER — ACETAMINOPHEN 650 MG RE SUPP: 650.0000 mg | RECTAL | Status: DC | PRN

## 2011-09-28 MED ORDER — SODIUM CHLORIDE 0.9 % IR SOLN
Status: DC | PRN
  Administered 2011-09-28: 3000 mL

## 2011-09-28 MED ORDER — PROPOFOL 10 MG/ML IV EMUL
INTRAVENOUS | Status: DC | PRN
  Administered 2011-09-28: 200 mg via INTRAVENOUS

## 2011-09-28 MED ORDER — EPHEDRINE SULFATE 50 MG/ML IJ SOLN
INTRAMUSCULAR | Status: DC | PRN
  Administered 2011-09-28: 10 mg via INTRAVENOUS

## 2011-09-28 MED ORDER — IOHEXOL 350 MG/ML SOLN
INTRAVENOUS | Status: DC | PRN
  Administered 2011-09-28: 6 mL via INTRAVENOUS

## 2011-09-28 SURGICAL SUPPLY — 38 items
BAG DRAIN URO-CYSTO SKYTR STRL (DRAIN) ×2 IMPLANT
BASKET LASER NITINOL 1.9FR (BASKET) IMPLANT
BASKET SEGURA 3FR (UROLOGICAL SUPPLIES) IMPLANT
BASKET STNLS GEMINI 4WIRE 3FR (BASKET) IMPLANT
BASKET ZERO TIP NITINOL 2.4FR (BASKET) IMPLANT
BRUSH URET BIOPSY 3F (UROLOGICAL SUPPLIES) IMPLANT
CANISTER SUCT LVC 12 LTR MEDI- (MISCELLANEOUS) ×2 IMPLANT
CATH FOLEY 2WAY SLVR  5CC 18FR (CATHETERS) ×1
CATH FOLEY 2WAY SLVR 5CC 18FR (CATHETERS) ×1 IMPLANT
CATH URET 5FR 28IN CONE TIP (BALLOONS)
CATH URET 5FR 28IN OPEN ENDED (CATHETERS) IMPLANT
CATH URET 5FR 70CM CONE TIP (BALLOONS) IMPLANT
CLOTH BEACON ORANGE TIMEOUT ST (SAFETY) ×2 IMPLANT
DRAPE CAMERA CLOSED 9X96 (DRAPES) ×2 IMPLANT
ELECT REM PT RETURN 9FT ADLT (ELECTROSURGICAL) ×2
ELECTRODE REM PT RTRN 9FT ADLT (ELECTROSURGICAL) ×1 IMPLANT
GLOVE BIO SURGEON STRL SZ 6.5 (GLOVE) ×2 IMPLANT
GLOVE INDICATOR 6.5 STRL GRN (GLOVE) ×2 IMPLANT
GLOVE INDICATOR 7.0 STRL GRN (GLOVE) ×2 IMPLANT
GLOVE SURG SS PI 8.0 STRL IVOR (GLOVE) ×2 IMPLANT
GLYCINE 1.5% IRRIG UROMATIC (IV SOLUTION) ×2 IMPLANT
GOWN STRL NON-REIN LRG LVL3 (GOWN DISPOSABLE) ×2 IMPLANT
GOWN STRL REIN XL XLG (GOWN DISPOSABLE) ×2 IMPLANT
GUIDEWIRE 0.038 PTFE COATED (WIRE) IMPLANT
GUIDEWIRE ANG ZIPWIRE 038X150 (WIRE) IMPLANT
GUIDEWIRE STR DUAL SENSOR (WIRE) ×2 IMPLANT
IV NS IRRIG 3000ML ARTHROMATIC (IV SOLUTION) ×4 IMPLANT
KIT BALLIN UROMAX 15FX10 (LABEL) IMPLANT
KIT BALLN UROMAX 15FX4 (MISCELLANEOUS) IMPLANT
KIT BALLN UROMAX 26 75X4 (MISCELLANEOUS)
LASER FIBER DISP (UROLOGICAL SUPPLIES) IMPLANT
LASER FIBER DISP 1000U (UROLOGICAL SUPPLIES) IMPLANT
PACK CYSTOSCOPY (CUSTOM PROCEDURE TRAY) ×2 IMPLANT
SET HIGH PRES BAL DIL (LABEL)
SHEATH ACCESS URETERAL 38CM (SHEATH) ×2 IMPLANT
SHEATH ACCESS URETERAL 54CM (SHEATH) IMPLANT
SYR 30ML LL (SYRINGE) ×2 IMPLANT
SYRINGE 10CC LL (SYRINGE) ×2 IMPLANT

## 2011-09-28 NOTE — Anesthesia Postprocedure Evaluation (Signed)
  Anesthesia Post-op Note  Patient: Colton Montgomery  Procedure(s) Performed: Procedure(s) (LRB): URETEROSCOPY (Right) CYSTOSCOPY WITH STENT REMOVAL (Right)  Patient Location: PACU  Anesthesia Type: General  Level of Consciousness: oriented and sedated  Airway and Oxygen Therapy: Patient Spontanous Breathing  Post-op Pain: mild  Post-op Assessment: Post-op Vital signs reviewed, Patient's Cardiovascular Status Stable, Respiratory Function Stable and Patent Airway  Post-op Vital Signs: stable  Complications: No apparent anesthesia complications

## 2011-09-28 NOTE — Interval H&P Note (Signed)
History and Physical Interval Note:  09/28/2011 8:38 AM  Colton Montgomery  has presented today for surgery, with the diagnosis of Right Hydronephrosis and Filling Defect  The various methods of treatment have been discussed with the patient and family. After consideration of risks, benefits and other options for treatment, the patient has consented to  Procedure(s) (LRB): URETEROSCOPY (Right) as a surgical intervention .  The patient's history has been reviewed, patient examined, no change in status, stable for surgery.  I have reviewed the patient's chart and labs.  Questions were answered to the patient's satisfaction.     Ridley Schewe J

## 2011-09-28 NOTE — Anesthesia Preprocedure Evaluation (Signed)
Anesthesia Evaluation  Patient identified by MRN, date of birth, ID band Patient awake    Reviewed: Allergy & Precautions, H&P , NPO status , Patient's Chart, lab work & pertinent test results, reviewed documented beta blocker date and time   Airway Mallampati: II TM Distance: >3 FB Neck ROM: Full    Dental  (+) Dental Advisory Given and Teeth Intact   Pulmonary former smoker,  breath sounds clear to auscultation        Cardiovascular hypertension, Pt. on medications + dysrhythmias Atrial Fibrillation Rhythm:Regular Rate:Normal  Ablation for A Fib- 2012 no recurrence Denies cardiac symptoms Low normal EF on Echo   Neuro/Psych negative neurological ROS  negative psych ROS   GI/Hepatic negative GI ROS, Neg liver ROS,   Endo/Other  Hypothyroidism Thyroid replacement  Renal/GU Filling defect rt kidney/hematuria Hydronephrosis -right  negative genitourinary   Musculoskeletal negative musculoskeletal ROS (+)   Abdominal   Peds negative pediatric ROS (+)  Hematology negative hematology ROS (+)   Anesthesia Other Findings A lot of bridge work  Reproductive/Obstetrics negative OB ROS                           Anesthesia Physical Anesthesia Plan  ASA: III  Anesthesia Plan: General   Post-op Pain Management:    Induction: Intravenous  Airway Management Planned: LMA  Additional Equipment:   Intra-op Plan:   Post-operative Plan: Extubation in OR  Informed Consent: I have reviewed the patients History and Physical, chart, labs and discussed the procedure including the risks, benefits and alternatives for the proposed anesthesia with the patient or authorized representative who has indicated his/her understanding and acceptance.   Dental advisory given  Plan Discussed with: CRNA and Surgeon  Anesthesia Plan Comments:         Anesthesia Quick Evaluation

## 2011-09-28 NOTE — Transfer of Care (Signed)
Immediate Anesthesia Transfer of Care Note  Patient: Colton Montgomery  Procedure(s) Performed: Procedure(s) (LRB): URETEROSCOPY (Right) CYSTOSCOPY WITH STENT REMOVAL (Right)  Patient Location: PACU  Anesthesia Type: General  Level of Consciousness: awake, alert  and oriented  Airway & Oxygen Therapy: Patient Spontanous Breathing and Patient connected to face mask oxygen  Post-op Assessment: Report given to PACU RN and Post -op Vital signs reviewed and stable  Post vital signs: Reviewed and stable  Complications: No apparent anesthesia complications

## 2011-09-28 NOTE — Brief Op Note (Signed)
09/28/2011  9:57 AM  PATIENT:  Colton Montgomery  71 y.o. male  PRE-OPERATIVE DIAGNOSIS:  Right Hydronephrosis and Filling Defect  POST-OPERATIVE DIAGNOSIS:  Right Hydronephrosis with a right UPJ obstruction, RUP bleeding from benign source.  PROCEDURE:  Procedure(s) (LRB): URETEROSCOPY with fulguration of bleeder. (Right) CYSTOSCOPY WITH STENT REMOVAL (Right)  SURGEON:  Surgeon(s) and Role:    * Anner Crete, MD - Primary  PHYSICIAN ASSISTANT:   ASSISTANTS: none   ANESTHESIA:   general  EBL:  Total I/O In: 200 [I.V.:200] Out: -   BLOOD ADMINISTERED:none  DRAINS: Urinary Catheter (Foley)   LOCAL MEDICATIONS USED:  NONE  SPECIMEN:  No Specimen  DISPOSITION OF SPECIMEN:  N/A  COUNTS:  YES  TOURNIQUET:  * No tourniquets in log *  DICTATION: .Other Dictation: Dictation Number [id number]  PLAN OF CARE: Discharge to home after PACU  PATIENT DISPOSITION:  PACU - hemodynamically stable.   Delay start of Pharmacological VTE agent (>24hrs) due to surgical blood loss or risk of bleeding: yes

## 2011-09-28 NOTE — H&P (View-Only) (Signed)
ive Problems Problems  1. Benign Prostatic Hypertrophy With Urinary Obstruction 600.01 2. Gross Hematuria 599.71 3. Hydronephrosis On The Right 591 4. IVP - Filling Defect  History of Present Illness  Colton Montgomery is a 71 yo WM sent in consultation by Dr. Cam Hai for gross hematuria.  He had the onset on July 5th of gross hematuria without dysuria.  He has some frequency and nocturia.   He had a UA that was Nit+ with red and white cells.  He had a negative culture but was given cipro 500mg  for 7 days.  He took the last Monday and had no improvement in the hematuria.  His UA is Nit+ today with TNTC RBC's with many bacteria but only 0-2 WBC's.   He has a history of a prior stone and gross hematuria in 2006.  He may have some LLQ discomfort but it is not severe.  He has no flank pain.   Past Medical History Problems  1. History of  Arthritis V13.4 2. History of  Heart Disease 429.9 3. History of  Hypercholesterolemia 272.0 4. History of  Nephrolithiasis V13.01  Surgical History Problems  1. History of  Catheter Ablation 2. History of  Hernia Repair 3. History of  Knee Surgery 4. History of  Throat Surgery  Current Meds 1. Acyclovir 400 MG Oral Tablet; Therapy: 11Jul2013 to 2. Aspirin 81 MG Oral Tablet; Therapy: (Recorded:17Jul2013) to 3. Carvedilol TABS; Therapy: (Recorded:17Jul2013) to 4. Oxycodone-Acetaminophen 5-325 MG Oral Tablet; Therapy: 03Apr2013 to 5. Ramipril CAPS; Therapy: (Recorded:17Jul2013) to 6. Simvastatin TABS; Therapy: (Recorded:17Jul2013) to  Allergies Medication  1. Tetracyclines  Family History Problems  1. Paternal history of  Alzheimer's Disease 2. Family history of  Death In The Family Father 3. Family history of  Death In The Family Mother 4. Family history of  Family Health Status Number Of Children  Social History Problems    Alcohol Use   Caffeine Use   Former Smoker V15.82   Marital History - Divorced V61.03   Occupation: Denied     History of  Tobacco Use 305.1  Review of Systems Genitourinary, constitutional, skin, eye, otolaryngeal, hematologic/lymphatic, cardiovascular, pulmonary, endocrine, musculoskeletal, gastrointestinal, neurological and psychiatric system(s) were reviewed and pertinent findings if present are noted.  Genitourinary: urinary frequency, feelings of urinary urgency, nocturia, difficulty starting the urinary stream, weak urinary stream and erectile dysfunction (He gets erections just not as often).  Musculoskeletal: joint pain.    Vitals Vital Signs [Data Includes: Last 1 Day]  17Jul2013 11:31AM  Heart Rate: 65 17Jul2013 11:26AM  BMI Calculated: 22.82 BSA Calculated: 1.93 Height: 5 ft 11 in Weight: 163 lb  Blood Pressure: 98 / 63 Temperature: 97.8 F Heart Rate: 36  Physical Exam Constitutional: Well nourished and well developed . No acute distress.  ENT:. The ears and nose are normal in appearance.  Neck: The appearance of the neck is normal and no neck mass is present.  Pulmonary: No respiratory distress and normal respiratory rhythm and effort.  Cardiovascular: Heart rate and rhythm are normal . No peripheral edema.  Abdomen: The abdomen is soft and nontender. No masses are palpated. No CVA tenderness. No hernias are palpable. No hepatosplenomegaly noted.  Rectal: Rectal exam demonstrates normal sphincter tone, no tenderness and no masses. Estimated prostate size is 2+. The prostate has no nodularity and is not tender. The left seminal vesicle is nonpalpable. The right seminal vesicle is nonpalpable. The perineum is normal on inspection.  Genitourinary: Examination of the penis demonstrates no discharge, no  masses, no lesions and a normal meatus. The scrotum is without lesions. The right epididymis is palpably normal and non-tender. The left epididymis is palpably normal and non-tender. The right testis is non-tender and without masses. The left testis is non-tender and without masses.    Lymphatics: The femoral and inguinal nodes are not enlarged or tender.  Skin: Normal skin turgor, no visible rash and no visible skin lesions.  Neuro/Psych:. Mood and affect are appropriate.    Results/Data Urine [Data Includes: Last 1 Day]   17Jul2013  COLOR RED   APPEARANCE CLOUDY   SPECIFIC GRAVITY 1.030   pH 6.5   GLUCOSE NEG mg/dL  BILIRUBIN MOD   KETONE NEG mg/dL  BLOOD LARGE   PROTEIN > 300 mg/dL  UROBILINOGEN 1 mg/dL  NITRITE POS   LEUKOCYTE ESTERASE TRACE   SQUAMOUS EPITHELIAL/HPF NONE SEEN   WBC 0-2 WBC/hpf  RBC TNTC RBC/hpf  BACTERIA MANY   CRYSTALS NONE SEEN   CASTS NONE SEEN    Old records or history reviewed: I have reviewed records and labs from Dr. Clelia Croft. I have reviewed our prior office records.  The following images/tracing/specimen were independently visualized:  CT hematuria study shows right hydro to the UPJ with a small filling defect in the pelvis that could be a clot or a tumor. He has small right renal cysts. He has a large prostate with a middle lobe and an anterior diverticulum. A full report is pending.    Assessment Assessed  1. Gross Hematuria 599.71 2. Hydronephrosis On The Right 591 3. IVP - Filling Defect 4. Benign Prostatic Hypertrophy With Urinary Obstruction 600.01   He has gross hematuria with a negative culture and right hydro with a filling defect. He has BPH with BOO and a diverticulum.   Plan Gross Hematuria (599.71)  1. AU CT-HEMATURIA PROTOCOL  Done: 17Jul2013 12:00AM 2. URINE CULTURE  Done: 17Jul2013 3. URINE CYTOLOGY  Requested for: 17Jul2013 Health Maintenance (V70.0)  4. UA With REFLEX  Done: 17Jul2013 11:15AM Hydronephrosis On The Right (591)  5. Follow-up Schedule Surgery Office  Follow-up  Requested for: 17Jul2013   I am going to set him up for cystoscopy with right RTG pyelogram, right ureteroscopy and stenting. I have reviewed the risks of bleeding, infection, ureteral injury, need for stent or second procedures,  thrombotic events and anesthetic complications. I am going to reculture his urine and also get a cytology today.

## 2011-09-28 NOTE — Anesthesia Procedure Notes (Signed)
Procedure Name: LMA Insertion Date/Time: 09/28/2011 9:07 AM Performed by: Norva Pavlov Pre-anesthesia Checklist: Patient identified, Emergency Drugs available, Suction available and Patient being monitored Patient Re-evaluated:Patient Re-evaluated prior to inductionOxygen Delivery Method: Circle System Utilized Preoxygenation: Pre-oxygenation with 100% oxygen Intubation Type: IV induction Ventilation: Mask ventilation without difficulty LMA: LMA inserted LMA Size: 4.0 Number of attempts: 1 Airway Equipment and Method: bite block Placement Confirmation: positive ETCO2 Tube secured with: Tape Dental Injury: Teeth and Oropharynx as per pre-operative assessment

## 2011-09-29 ENCOUNTER — Encounter (HOSPITAL_BASED_OUTPATIENT_CLINIC_OR_DEPARTMENT_OTHER): Payer: Self-pay | Admitting: Urology

## 2011-09-29 NOTE — Op Note (Signed)
Colton Montgomery, Colton Montgomery               ACCOUNT NO.:  [id number]  MEDICAL RECORD NO.:  [id number]  LOCATION:                               FACILITY:  [facility]  PHYSICIAN:  Excell Seltzer. Annabell Howells, M.D.    DATE OF BIRTH:  25-Sep-1940  DATE OF PROCEDURE:  09/28/2011 DATE OF DISCHARGE:                              OPERATIVE REPORT   PROCEDURE:  Cystoscopy, removal of right double-J stent, right ureteroscopy with fulguration of bleeder.  PREOPERATIVE DIAGNOSIS:  Right ureteropelvic junction obstruction with hydronephrosis and hematuria.  POSTOPERATIVE DIAGNOSIS:  Right ureteropelvic junction obstruction with hydronephrosis and hematuria with active benign bleeding from the upper pole.  SURGEON:  Excell Seltzer. Annabell Howells, MD  ANESTHESIA:  General.  SPECIMEN:  None.  DRAINS:  Foley catheter.  COMPLICATIONS:  None.  INDICATIONS:  [name] is a 71 year old white male, who presented approximately a week and a half ago with gross hematuria.  CT scan showed dilated right collecting system with the probable UPJ and a filling defect in the lower pole.  He originally underwent cystoscopy with retrograde pyelogram since stent placement.  The ureter was not readily dilated, so did not attempt ureteroscopy on initial approach. He had a cytology from the renal pelvis, it just showed reactive cells, clots were evacuated at that time.  He returns today for ureteroscopy findings.  PROCEDURE IN DETAIL:  He was taken to the operating room, where he was given Cipro, a general anesthetic was induced.  He was placed in lithotomy position and fitted with PAS hose.  Perineum and genitalia were prepped with Betadine solution, and was draped in usual sterile fashion.  Cystoscopy was performed using a 20-French scope and 12-degree lens. Description of the prostate and bladder were noted on the previous cysto, but he did have trilobar hyperplasia with large middle lobe and did have retention after his last procedure.  The  stent was visualized, grasped with a grasping forceps, and pulled the urethral meatus were guidewire was passed to the kidney and the stent was removed.  A 38-cm digital access sheath was then brought on the field.  The inner core was passed initially without difficulty.  The device was reassembled and inserted just below the kidney, the inner core and wire removed.  The digital flexible ureteroscope was then passed through the sheath and negotiated into the kidney.  There remained some bloody urine in this area which was aspirated from the kidney approximately 150-200 mL of fluid was aspirated.  At the very end of aspiration, some clots were encountered and evacuated in the syringe.  At this point, with fresh irrigation, I was able to identify venous oozing bleeder in an upper pole calyx.  The surrounding tissue appeared completely benign.  Further inspection of the entire collecting system revealed nothing suspicious for neoplasm.  There were some stent irritation, but no other abnormalities were noted.  After a thorough inspection was performed, the area of bleeding was re- identified and the irrigation was changed to glycine.  A Bugbee electrode was used to cauterize the area of venous bleeding.  At this point, inspection revealed no active bleeding, and the scope was removed under direct vision.  The  patient did not like the stent and I did not feel replacement was indicated.  Inspection of the ureter revealed minimal trauma upon removal of the scope.  Once the scope and sheath were removed, an 18-French Foley catheter was inserted.  Balloon was filled with 10 mL of sterile fluid.  This would be left indwelling until he is sufficiently recovered to go to stage II and then removed for a voiding trial.  He will be able to go home when he is able to avoid.  His anesthetic was reversed.  He was moved to recovery room in stable condition.  There were no complications.     Excell Seltzer. Annabell Howells, M.D.     JJW/MEDQ  D:  09/28/2011  T:  09/29/2011  Job:  [id number]

## 2011-10-08 ENCOUNTER — Other Ambulatory Visit: Payer: Self-pay | Admitting: Urology

## 2011-10-08 DIAGNOSIS — N135 Crossing vessel and stricture of ureter without hydronephrosis: Secondary | ICD-10-CM

## 2011-10-12 ENCOUNTER — Encounter (HOSPITAL_COMMUNITY): Payer: Self-pay

## 2011-10-12 ENCOUNTER — Encounter (HOSPITAL_COMMUNITY)
Admission: RE | Admit: 2011-10-12 | Discharge: 2011-10-12 | Disposition: A | Discharge status: Home / Self Care | Payer: BC Managed Care – PPO | Source: Ambulatory Visit | Attending: Urology | Admitting: Urology

## 2011-10-12 DIAGNOSIS — N135 Crossing vessel and stricture of ureter without hydronephrosis: Secondary | ICD-10-CM | POA: Insufficient documentation

## 2011-10-12 MED ORDER — FUROSEMIDE 10 MG/ML IJ SOLN
40.0000 mg | Freq: Once | INTRAMUSCULAR | Status: DC
  Filled 2011-10-12: qty 4

## 2011-10-12 MED ORDER — TECHNETIUM TC 99M MERTIATIDE
15.9000 | Freq: Once | INTRAVENOUS | Status: AC | PRN
  Administered 2011-10-12: 15.9 via INTRAVENOUS

## 2011-12-21 ENCOUNTER — Other Ambulatory Visit: Payer: Self-pay | Admitting: Sports Medicine

## 2011-12-21 DIAGNOSIS — M169 Osteoarthritis of hip, unspecified: Secondary | ICD-10-CM

## 2011-12-23 ENCOUNTER — Other Ambulatory Visit: Payer: BC Managed Care – PPO

## 2011-12-27 ENCOUNTER — Ambulatory Visit
Admission: RE | Admit: 2011-12-27 | Discharge: 2011-12-27 | Disposition: A | Discharge status: Home / Self Care | Payer: BC Managed Care – PPO | Source: Ambulatory Visit | Attending: Sports Medicine | Admitting: Sports Medicine

## 2011-12-27 DIAGNOSIS — M169 Osteoarthritis of hip, unspecified: Secondary | ICD-10-CM

## 2011-12-27 MED ORDER — METHYLPREDNISOLONE ACETATE 40 MG/ML INJ SUSP (RADIOLOG
120.0000 mg | Freq: Once | INTRAMUSCULAR | Status: AC
  Administered 2011-12-27: 120 mg via INTRA_ARTICULAR

## 2011-12-27 MED ORDER — IOHEXOL 180 MG/ML  SOLN
1.0000 mL | Freq: Once | INTRAMUSCULAR | Status: AC | PRN
  Administered 2011-12-27: 1 mL via INTRA_ARTICULAR

## 2012-03-02 ENCOUNTER — Ambulatory Visit (HOSPITAL_COMMUNITY)
Admission: RE | Admit: 2012-03-02 | Discharge: 2012-03-02 | Disposition: A | Discharge status: Home / Self Care | Payer: BC Managed Care – PPO | Source: Ambulatory Visit | Attending: Cardiovascular Disease | Admitting: Cardiovascular Disease

## 2012-03-02 ENCOUNTER — Other Ambulatory Visit (HOSPITAL_COMMUNITY): Payer: Self-pay | Admitting: Cardiovascular Disease

## 2012-03-02 DIAGNOSIS — I517 Cardiomegaly: Secondary | ICD-10-CM | POA: Insufficient documentation

## 2012-03-02 DIAGNOSIS — I4891 Unspecified atrial fibrillation: Secondary | ICD-10-CM | POA: Insufficient documentation

## 2012-03-02 DIAGNOSIS — I2510 Atherosclerotic heart disease of native coronary artery without angina pectoris: Secondary | ICD-10-CM | POA: Insufficient documentation

## 2012-03-02 DIAGNOSIS — E785 Hyperlipidemia, unspecified: Secondary | ICD-10-CM | POA: Insufficient documentation

## 2012-03-02 DIAGNOSIS — I10 Essential (primary) hypertension: Secondary | ICD-10-CM | POA: Insufficient documentation

## 2012-03-02 DIAGNOSIS — I4510 Unspecified right bundle-branch block: Secondary | ICD-10-CM | POA: Insufficient documentation

## 2012-03-03 NOTE — Progress Notes (Signed)
[facility]   2D echo completed 03/02/2012.   Cindy Lamar Naef, RDCS   

## 2012-05-31 ENCOUNTER — Encounter: Payer: Self-pay | Admitting: *Deleted

## 2012-06-01 ENCOUNTER — Encounter: Payer: Self-pay | Admitting: Cardiovascular Disease

## 2012-08-02 ENCOUNTER — Encounter: Payer: Self-pay | Admitting: Physician Assistant

## 2012-08-02 ENCOUNTER — Other Ambulatory Visit: Payer: Self-pay | Admitting: Physician Assistant

## 2012-08-02 DIAGNOSIS — R93429 Abnormal radiologic findings on diagnostic imaging of unspecified kidney: Secondary | ICD-10-CM

## 2012-08-02 DIAGNOSIS — Z8679 Personal history of other diseases of the circulatory system: Secondary | ICD-10-CM

## 2012-08-02 DIAGNOSIS — R490 Dysphonia: Secondary | ICD-10-CM | POA: Insufficient documentation

## 2012-08-02 DIAGNOSIS — E039 Hypothyroidism, unspecified: Secondary | ICD-10-CM | POA: Insufficient documentation

## 2012-08-02 DIAGNOSIS — I452 Bifascicular block: Secondary | ICD-10-CM | POA: Insufficient documentation

## 2012-08-02 DIAGNOSIS — I493 Ventricular premature depolarization: Secondary | ICD-10-CM

## 2012-08-02 DIAGNOSIS — N1330 Unspecified hydronephrosis: Secondary | ICD-10-CM | POA: Insufficient documentation

## 2012-08-02 DIAGNOSIS — Z9889 Other specified postprocedural states: Secondary | ICD-10-CM

## 2012-08-02 DIAGNOSIS — Z96652 Presence of left artificial knee joint: Secondary | ICD-10-CM

## 2012-08-02 DIAGNOSIS — I4510 Unspecified right bundle-branch block: Secondary | ICD-10-CM
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
## 2012-08-03 ENCOUNTER — Encounter (HOSPITAL_COMMUNITY): Payer: Self-pay | Admitting: Pharmacist

## 2012-08-07 ENCOUNTER — Encounter (HOSPITAL_COMMUNITY)
Admission: RE | Admit: 2012-08-07 | Discharge: 2012-08-07 | Disposition: A | Discharge status: Home / Self Care | Payer: BC Managed Care – PPO | Source: Ambulatory Visit | Attending: Orthopedic Surgery | Admitting: Orthopedic Surgery

## 2012-08-07 ENCOUNTER — Encounter (HOSPITAL_COMMUNITY): Payer: Self-pay

## 2012-08-07 ENCOUNTER — Ambulatory Visit (HOSPITAL_COMMUNITY)
Admission: RE | Admit: 2012-08-07 | Discharge: 2012-08-07 | Disposition: A | Discharge status: Home / Self Care | Payer: BC Managed Care – PPO | Source: Ambulatory Visit | Attending: Physician Assistant | Admitting: Physician Assistant

## 2012-08-07 DIAGNOSIS — Z01818 Encounter for other preprocedural examination: Secondary | ICD-10-CM | POA: Insufficient documentation

## 2012-08-07 DIAGNOSIS — Z01812 Encounter for preprocedural laboratory examination: Secondary | ICD-10-CM | POA: Insufficient documentation

## 2012-08-07 LAB — COMPREHENSIVE METABOLIC PANEL
ALT: 10 U/L (ref 0–53)
AST: 19 U/L (ref 0–37)
Albumin: 3.5 g/dL (ref 3.5–5.2)
Alkaline Phosphatase: 83 U/L (ref 39–117)
BUN: 22 mg/dL (ref 6–23)
CO2: 27 mEq/L (ref 19–32)
Calcium: 9.3 mg/dL (ref 8.4–10.5)
Chloride: 103 mEq/L (ref 96–112)
Creatinine, Ser: 0.75 mg/dL (ref 0.50–1.35)
GFR calc Af Amer: >90 mL/min (ref >90)
GFR calc non Af Amer: >90 mL/min (ref >90)
Glucose, Bld: 104 mg/dL — ABNORMAL HIGH (ref 70–99)
Potassium: 4.3 mEq/L (ref 3.5–5.1)
Sodium: 138 mEq/L (ref 135–145)
Total Bilirubin: 0.5 mg/dL (ref 0.3–1.2)
Total Protein: 7.4 g/dL (ref 6.0–8.3)

## 2012-08-07 LAB — TYPE AND SCREEN
ABO/RH(D): O POS
Antibody Screen: NEGATIVE

## 2012-08-07 LAB — URINALYSIS, ROUTINE W REFLEX MICROSCOPIC
Bilirubin Urine: NEGATIVE
Glucose, UA: NEGATIVE mg/dL
Hgb urine dipstick: NEGATIVE
Ketones, ur: NEGATIVE mg/dL
Leukocytes, UA: NEGATIVE
Nitrite: NEGATIVE
Protein, ur: NEGATIVE mg/dL
Specific Gravity, Urine: >1.03 — ABNORMAL HIGH (ref 1.005–1.030)
Urobilinogen, UA: 0.2 mg/dL (ref 0.0–1.0)
pH: 6 (ref 5.0–8.0)

## 2012-08-07 LAB — CBC WITH DIFFERENTIAL/PLATELET
Basophils Absolute: 0 10*3/uL (ref 0.0–0.1)
Basophils Relative: 0 % (ref 0–1)
Eosinophils Absolute: 0.3 10*3/uL (ref 0.0–0.7)
Eosinophils Relative: 3 % (ref 0–5)
HCT: 41.7 % (ref 39.0–52.0)
Hemoglobin: 14.1 g/dL (ref 13.0–17.0)
Lymphocytes Relative: 25 % (ref 12–46)
Lymphs Abs: 2 10*3/uL (ref 0.7–4.0)
MCH: 30.3 pg (ref 26.0–34.0)
MCHC: 33.8 g/dL (ref 30.0–36.0)
MCV: 89.5 fL (ref 78.0–100.0)
Monocytes Absolute: 0.7 10*3/uL (ref 0.1–1.0)
Monocytes Relative: 9 % (ref 3–12)
Neutro Abs: 5 10*3/uL (ref 1.7–7.7)
Neutrophils Relative %: 63 % (ref 43–77)
Platelets: 191 10*3/uL (ref 150–400)
RBC: 4.66 MIL/uL (ref 4.22–5.81)
RDW: 13.6 % (ref 11.5–15.5)
WBC: 7.9 10*3/uL (ref 4.0–10.5)

## 2012-08-07 LAB — APTT: aPTT: 32 seconds (ref 24–37)

## 2012-08-07 LAB — PROTIME-INR
INR: 1.11 (ref 0.00–1.49)
Prothrombin Time: 14.2 seconds (ref 11.6–15.2)

## 2012-08-07 LAB — SURGICAL PCR SCREEN
MRSA, PCR: NEGATIVE
Staphylococcus aureus: NEGATIVE

## 2012-08-07 MED ORDER — CHLORHEXIDINE GLUCONATE 4 % EX LIQD: 60.0000 mL | Freq: Once | CUTANEOUS | Status: DC

## 2012-08-07 NOTE — Pre-Procedure Instructions (Signed)
Colton Montgomery  08/07/2012   Your procedure is scheduled on:  Monday, June 16th.  Report to [facility] at 5:30AM.  Call this number if you have problems the morning of surgery: [phone] Enter through the Main Entrance , take [employer] [facility] [employer] to 3rd Floor, Short Stay.   Remember:   Do not eat food or drink liquids after midnight.   Take these medicines the morning of surgery with A SIP OF WATER: carvedilol (COREG),levothyroxine (SYNTHROID, LEVOTHROID).     Do not wear jewelry, make-up or nail polish.  Do not wear lotions, powders, or perfumes. You may wear deodorant.  Do not shave 48 hours prior to surgery. Men may shave face and neck.  Do not bring valuables to the hospital.  [facility] is not responsible for any belongings or valuables.  Contacts, dentures or bridgework may not be worn into surgery.  Leave suitcase in the car. After surgery it may be brought to your room.  For patients admitted to the hospital, checkout time is 11:00 AM the day of discharge.    Special Instructions: Shower using CHG 2 nights before surgery and the night before surgery.  If you shower the day of surgery use CHG.  Use special wash - you have one bottle of CHG for all showers.  You should use approximately 1/3 of the bottle for each shower.   Please read over the following fact sheets that you were given: Pain Booklet, Coughing and Deep Breathing, Blood Transfusion Information and Surgical Site Infection Prevention

## 2012-08-07 NOTE — Progress Notes (Signed)
Left phone message for Sherrie to call nurse concerning Cardiac  Clearance for patient's surgery or fax me a copy from [facility].

## 2012-08-08 LAB — URINE CULTURE
Colony Count: NO GROWTH
Culture: NO GROWTH

## 2012-08-09 ENCOUNTER — Encounter (HOSPITAL_COMMUNITY): Payer: Self-pay
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
## 2012-08-18 ENCOUNTER — Other Ambulatory Visit: Payer: Self-pay

## 2012-08-18 MED ORDER — CARVEDILOL 25 MG PO TABS: 12.5000 mg | ORAL_TABLET | Freq: Two times a day (BID) | ORAL | Status: DC

## 2012-08-18 MED ORDER — RAMIPRIL 10 MG PO CAPS: 10.0000 mg | ORAL_CAPSULE | Freq: Every morning | ORAL | Status: DC

## 2012-08-18 NOTE — Telephone Encounter (Signed)
Rx was sent to pharmacy electronically. 

## 2012-08-20 MED ORDER — CEFAZOLIN SODIUM-DEXTROSE 2-3 GM-% IV SOLR
2.0000 g | INTRAVENOUS | Status: AC
  Administered 2012-08-21: 2 g via INTRAVENOUS
  Filled 2012-08-20: qty 50

## 2012-08-20 MED ORDER — CHLORHEXIDINE GLUCONATE 4 % EX LIQD: 60.0000 mL | Freq: Once | CUTANEOUS | Status: DC

## 2012-08-20 MED ORDER — POVIDONE-IODINE 7.5 % EX SOLN
Freq: Once | CUTANEOUS | Status: DC
  Filled 2012-08-20: qty 118

## 2012-08-21 ENCOUNTER — Encounter (HOSPITAL_COMMUNITY)
Admission: RE | Disposition: A | Discharge status: Home / Self Care | Payer: Self-pay | Source: Ambulatory Visit | Attending: Orthopedic Surgery

## 2012-08-21 ENCOUNTER — Encounter (HOSPITAL_COMMUNITY): Payer: Self-pay | Admitting: *Deleted

## 2012-08-21 ENCOUNTER — Inpatient Hospital Stay (HOSPITAL_COMMUNITY)
Admission: RE | Admit: 2012-08-21 | Discharge: 2012-08-24 | DRG: 818 | Disposition: A | Discharge status: Home / Self Care | Payer: BC Managed Care – PPO | Source: Ambulatory Visit | Attending: Orthopedic Surgery | Admitting: Orthopedic Surgery

## 2012-08-21 ENCOUNTER — Inpatient Hospital Stay (HOSPITAL_COMMUNITY): Payer: BC Managed Care – PPO

## 2012-08-21 ENCOUNTER — Encounter (HOSPITAL_COMMUNITY): Payer: Self-pay | Admitting: Vascular Surgery

## 2012-08-21 ENCOUNTER — Inpatient Hospital Stay (HOSPITAL_COMMUNITY): Payer: BC Managed Care – PPO | Admitting: Certified Registered Nurse Anesthetist

## 2012-08-21 DIAGNOSIS — R490 Dysphonia: Secondary | ICD-10-CM | POA: Diagnosis present

## 2012-08-21 DIAGNOSIS — E785 Hyperlipidemia, unspecified: Secondary | ICD-10-CM | POA: Diagnosis present

## 2012-08-21 DIAGNOSIS — M169 Osteoarthritis of hip, unspecified: Secondary | ICD-10-CM | POA: Diagnosis present

## 2012-08-21 DIAGNOSIS — I4510 Unspecified right bundle-branch block: Secondary | ICD-10-CM | POA: Diagnosis present

## 2012-08-21 DIAGNOSIS — I4949 Other premature depolarization: Secondary | ICD-10-CM | POA: Diagnosis present

## 2012-08-21 DIAGNOSIS — Z96659 Presence of unspecified artificial knee joint: Secondary | ICD-10-CM

## 2012-08-21 DIAGNOSIS — R93429 Abnormal radiologic findings on diagnostic imaging of unspecified kidney: Secondary | ICD-10-CM | POA: Diagnosis present

## 2012-08-21 DIAGNOSIS — I4891 Unspecified atrial fibrillation: Secondary | ICD-10-CM | POA: Diagnosis present

## 2012-08-21 DIAGNOSIS — Z87891 Personal history of nicotine dependence: Secondary | ICD-10-CM

## 2012-08-21 DIAGNOSIS — N1330 Unspecified hydronephrosis: Secondary | ICD-10-CM | POA: Diagnosis present

## 2012-08-21 DIAGNOSIS — R339 Retention of urine, unspecified: Secondary | ICD-10-CM | POA: Diagnosis not present

## 2012-08-21 DIAGNOSIS — E039 Hypothyroidism, unspecified: Secondary | ICD-10-CM | POA: Diagnosis present

## 2012-08-21 DIAGNOSIS — I10 Essential (primary) hypertension: Secondary | ICD-10-CM | POA: Diagnosis present

## 2012-08-21 DIAGNOSIS — I493 Ventricular premature depolarization: Secondary | ICD-10-CM | POA: Diagnosis present

## 2012-08-21 DIAGNOSIS — Z9889 Other specified postprocedural states: Secondary | ICD-10-CM

## 2012-08-21 DIAGNOSIS — I452 Bifascicular block: Secondary | ICD-10-CM | POA: Diagnosis present

## 2012-08-21 DIAGNOSIS — I2510 Atherosclerotic heart disease of native coronary artery without angina pectoris: Secondary | ICD-10-CM | POA: Diagnosis present

## 2012-08-21 DIAGNOSIS — Z7982 Long term (current) use of aspirin: Secondary | ICD-10-CM

## 2012-08-21 DIAGNOSIS — Z8679 Personal history of other diseases of the circulatory system: Secondary | ICD-10-CM

## 2012-08-21 DIAGNOSIS — M1610 Unilateral primary osteoarthritis, unspecified hip: Principal | ICD-10-CM | POA: Diagnosis present

## 2012-08-21 HISTORY — PX: TOTAL HIP ARTHROPLASTY: SHX124

## 2012-08-21 SURGERY — ARTHROPLASTY, HIP, TOTAL, ANTERIOR APPROACH
Anesthesia: General | Site: Hip | Laterality: Left | Wound class: Clean

## 2012-08-21 MED ORDER — PHENOL 1.4 % MT LIQD: 1.0000 | OROMUCOSAL | Status: DC | PRN

## 2012-08-21 MED ORDER — GLYCOPYRROLATE 0.2 MG/ML IJ SOLN
INTRAMUSCULAR | Status: DC | PRN
  Administered 2012-08-21: 0.6 mg via INTRAVENOUS

## 2012-08-21 MED ORDER — ACETAMINOPHEN 325 MG PO TABS: 650.0000 mg | ORAL_TABLET | Freq: Four times a day (QID) | ORAL | Status: DC | PRN

## 2012-08-21 MED ORDER — LEVOTHYROXINE SODIUM 50 MCG PO TABS
50.0000 ug | ORAL_TABLET | Freq: Every day | ORAL | Status: DC
  Administered 2012-08-22 – 2012-08-23 (×2): 50 ug via ORAL
  Filled 2012-08-21 (×4): qty 1

## 2012-08-21 MED ORDER — MEPERIDINE HCL 25 MG/ML IJ SOLN: 6.2500 mg | INTRAMUSCULAR | Status: DC | PRN

## 2012-08-21 MED ORDER — PROMETHAZINE HCL 25 MG/ML IJ SOLN: 6.2500 mg | INTRAMUSCULAR | Status: DC | PRN

## 2012-08-21 MED ORDER — 0.9 % SODIUM CHLORIDE (POUR BTL) OPTIME
TOPICAL | Status: DC | PRN
  Administered 2012-08-21: 1000 mL

## 2012-08-21 MED ORDER — OXYCODONE HCL 5 MG PO TABS
5.0000 mg | ORAL_TABLET | ORAL | Status: DC | PRN
  Administered 2012-08-21: 10 mg via ORAL
  Filled 2012-08-21: qty 2

## 2012-08-21 MED ORDER — SIMVASTATIN 40 MG PO TABS
40.0000 mg | ORAL_TABLET | Freq: Every morning | ORAL | Status: DC
  Administered 2012-08-22 – 2012-08-24 (×3): 40 mg via ORAL
  Filled 2012-08-21 (×3): qty 1

## 2012-08-21 MED ORDER — MIDAZOLAM HCL 5 MG/5ML IJ SOLN
INTRAMUSCULAR | Status: DC | PRN
  Administered 2012-08-21: 2 mg via INTRAVENOUS

## 2012-08-21 MED ORDER — HYDROMORPHONE HCL PF 1 MG/ML IJ SOLN
0.2500 mg | INTRAMUSCULAR | Status: DC | PRN
  Administered 2012-08-21 (×2): 0.5 mg via INTRAVENOUS

## 2012-08-21 MED ORDER — METOCLOPRAMIDE HCL 5 MG/ML IJ SOLN: 5.0000 mg | Freq: Three times a day (TID) | INTRAMUSCULAR | Status: DC | PRN

## 2012-08-21 MED ORDER — ASPIRIN EC 325 MG PO TBEC
325.0000 mg | DELAYED_RELEASE_TABLET | Freq: Two times a day (BID) | ORAL | Status: DC
  Administered 2012-08-21 – 2012-08-23 (×3): 325 mg via ORAL
  Filled 2012-08-21 (×10): qty 1

## 2012-08-21 MED ORDER — NEOSTIGMINE METHYLSULFATE 1 MG/ML IJ SOLN
INTRAMUSCULAR | Status: DC | PRN
  Administered 2012-08-21: 4 mg via INTRAVENOUS

## 2012-08-21 MED ORDER — LACTATED RINGERS IV SOLN: INTRAVENOUS | Status: DC

## 2012-08-21 MED ORDER — CARVEDILOL 12.5 MG PO TABS
12.5000 mg | ORAL_TABLET | Freq: Two times a day (BID) | ORAL | Status: DC
  Administered 2012-08-22 – 2012-08-24 (×4): 12.5 mg via ORAL
  Filled 2012-08-21 (×7): qty 1

## 2012-08-21 MED ORDER — OXYCODONE HCL 5 MG/5ML PO SOLN: 5.0000 mg | Freq: Once | ORAL | Status: DC | PRN

## 2012-08-21 MED ORDER — METOCLOPRAMIDE HCL 10 MG PO TABS: 5.0000 mg | ORAL_TABLET | Freq: Three times a day (TID) | ORAL | Status: DC | PRN

## 2012-08-21 MED ORDER — SORBITOL 70 % SOLN
30.0000 mL | Freq: Two times a day (BID) | Status: DC
  Administered 2012-08-21 – 2012-08-23 (×4): 30 mL via ORAL
  Filled 2012-08-21 (×8): qty 30

## 2012-08-21 MED ORDER — MIDAZOLAM HCL 2 MG/2ML IJ SOLN: 0.5000 mg | Freq: Once | INTRAMUSCULAR | Status: DC | PRN

## 2012-08-21 MED ORDER — ONDANSETRON HCL 4 MG/2ML IJ SOLN
INTRAMUSCULAR | Status: DC | PRN
  Administered 2012-08-21: 4 mg via INTRAVENOUS

## 2012-08-21 MED ORDER — HYDROMORPHONE HCL PF 1 MG/ML IJ SOLN
INTRAMUSCULAR | Status: AC
  Filled 2012-08-21: qty 2

## 2012-08-21 MED ORDER — RAMIPRIL 10 MG PO CAPS
10.0000 mg | ORAL_CAPSULE | Freq: Every morning | ORAL | Status: DC
  Administered 2012-08-22 – 2012-08-24 (×2): 10 mg via ORAL
  Filled 2012-08-21 (×3): qty 1

## 2012-08-21 MED ORDER — ONDANSETRON HCL 4 MG PO TABS: 4.0000 mg | ORAL_TABLET | Freq: Four times a day (QID) | ORAL | Status: DC | PRN

## 2012-08-21 MED ORDER — PROPOFOL 10 MG/ML IV BOLUS
INTRAVENOUS | Status: DC | PRN
  Administered 2012-08-21: 120 mg via INTRAVENOUS

## 2012-08-21 MED ORDER — ALBUMIN HUMAN 5 % IV SOLN
INTRAVENOUS | Status: DC | PRN
  Administered 2012-08-21 (×2): via INTRAVENOUS

## 2012-08-21 MED ORDER — OXYCODONE HCL 5 MG PO TABS
5.0000 mg | ORAL_TABLET | Freq: Once | ORAL | Status: AC | PRN
  Administered 2012-08-21: 5 mg via ORAL

## 2012-08-21 MED ORDER — DOCUSATE SODIUM 100 MG PO CAPS
100.0000 mg | ORAL_CAPSULE | Freq: Two times a day (BID) | ORAL | Status: DC
  Administered 2012-08-21 – 2012-08-23 (×4): 100 mg via ORAL
  Filled 2012-08-21 (×7): qty 1

## 2012-08-21 MED ORDER — ACETAMINOPHEN 650 MG RE SUPP: 650.0000 mg | Freq: Four times a day (QID) | RECTAL | Status: DC | PRN

## 2012-08-21 MED ORDER — OXYCODONE HCL 5 MG/5ML PO SOLN: 5.0000 mg | Freq: Once | ORAL | Status: AC | PRN

## 2012-08-21 MED ORDER — OXYCODONE HCL ER 10 MG PO T12A
40.0000 mg | EXTENDED_RELEASE_TABLET | Freq: Two times a day (BID) | ORAL | Status: DC
  Administered 2012-08-21 – 2012-08-22 (×3): 40 mg via ORAL
  Filled 2012-08-21 (×5): qty 4

## 2012-08-21 MED ORDER — EPHEDRINE SULFATE 50 MG/ML IJ SOLN
INTRAMUSCULAR | Status: DC | PRN
  Administered 2012-08-21: 15 mg via INTRAVENOUS
  Administered 2012-08-21: 10 mg via INTRAVENOUS

## 2012-08-21 MED ORDER — FENTANYL CITRATE 0.05 MG/ML IJ SOLN
INTRAMUSCULAR | Status: DC | PRN
  Administered 2012-08-21: 250 ug via INTRAVENOUS

## 2012-08-21 MED ORDER — LACTATED RINGERS IV SOLN
INTRAVENOUS | Status: DC | PRN
  Administered 2012-08-21 (×2): via INTRAVENOUS

## 2012-08-21 MED ORDER — LIDOCAINE HCL (CARDIAC) 20 MG/ML IV SOLN
INTRAVENOUS | Status: DC | PRN
  Administered 2012-08-21: 40 mg via INTRAVENOUS

## 2012-08-21 MED ORDER — CEFAZOLIN SODIUM-DEXTROSE 2-3 GM-% IV SOLR
2.0000 g | Freq: Four times a day (QID) | INTRAVENOUS | Status: AC
  Administered 2012-08-21: 2 g via INTRAVENOUS
  Filled 2012-08-21 (×2): qty 50

## 2012-08-21 MED ORDER — ONDANSETRON HCL 4 MG/2ML IJ SOLN: 4.0000 mg | Freq: Four times a day (QID) | INTRAMUSCULAR | Status: DC | PRN

## 2012-08-21 MED ORDER — OXYCODONE HCL 5 MG PO TABS: 5.0000 mg | ORAL_TABLET | Freq: Once | ORAL | Status: DC | PRN

## 2012-08-21 MED ORDER — OXYCODONE HCL 5 MG PO TABS
ORAL_TABLET | ORAL | Status: AC
  Filled 2012-08-21: qty 1

## 2012-08-21 MED ORDER — PHENYLEPHRINE HCL 10 MG/ML IJ SOLN
INTRAMUSCULAR | Status: DC | PRN
  Administered 2012-08-21 (×2): 80 ug via INTRAVENOUS

## 2012-08-21 MED ORDER — ROCURONIUM BROMIDE 100 MG/10ML IV SOLN
INTRAVENOUS | Status: DC | PRN
  Administered 2012-08-21: 50 mg via INTRAVENOUS

## 2012-08-21 MED ORDER — POTASSIUM CHLORIDE IN NACL 20-0.9 MEQ/L-% IV SOLN
INTRAVENOUS | Status: DC
  Filled 2012-08-21 (×3): qty 1000

## 2012-08-21 MED ORDER — HYDROMORPHONE HCL PF 1 MG/ML IJ SOLN
0.5000 mg | INTRAMUSCULAR | Status: DC | PRN
  Administered 2012-08-21: 1 mg via INTRAVENOUS
  Filled 2012-08-21: qty 1

## 2012-08-21 MED ORDER — MENTHOL 3 MG MT LOZG: 1.0000 | LOZENGE | OROMUCOSAL | Status: DC | PRN

## 2012-08-21 SURGICAL SUPPLY — 50 items
BLADE SAW SGTL 18X1.27X75 (BLADE) ×2 IMPLANT
BLADE SURG ROTATE 9660 (MISCELLANEOUS) IMPLANT
CAPT HIP PF COP ×2 IMPLANT
CELLS DAT CNTRL 66122 CELL SVR (MISCELLANEOUS) ×1 IMPLANT
CLOTH BEACON ORANGE TIMEOUT ST (SAFETY) ×2 IMPLANT
COVER BACK TABLE 24X17X13 BIG (DRAPES) IMPLANT
COVER SURGICAL LIGHT HANDLE (MISCELLANEOUS) ×2 IMPLANT
DRAPE C-ARM 42X72 X-RAY (DRAPES) ×2 IMPLANT
DRAPE STERI IOBAN 125X83 (DRAPES) ×2 IMPLANT
DRAPE U-SHAPE 47X51 STRL (DRAPES) ×6 IMPLANT
DRESSING AQUACEL AQ EXTRA 4X5 (GAUZE/BANDAGES/DRESSINGS) ×2 IMPLANT
DRSG AQUACEL AG ADV 3.5X10 (GAUZE/BANDAGES/DRESSINGS) ×2 IMPLANT
DURAPREP 26ML APPLICATOR (WOUND CARE) ×2 IMPLANT
ELECT BLADE 4.0 EZ CLEAN MEGAD (MISCELLANEOUS)
ELECT BLADE TIP CTD 4 INCH (ELECTRODE) ×2 IMPLANT
ELECT CAUTERY BLADE 6.4 (BLADE) ×2 IMPLANT
ELECT REM PT RETURN 9FT ADLT (ELECTROSURGICAL) ×2
ELECTRODE BLDE 4.0 EZ CLN MEGD (MISCELLANEOUS) IMPLANT
ELECTRODE REM PT RTRN 9FT ADLT (ELECTROSURGICAL) ×1 IMPLANT
FACESHIELD LNG OPTICON STERILE (SAFETY) ×4 IMPLANT
GAUZE XEROFORM 1X8 LF (GAUZE/BANDAGES/DRESSINGS) ×2 IMPLANT
GLOVE BIO SURGEON STRL SZ7 (GLOVE) ×2 IMPLANT
GLOVE BIOGEL PI IND STRL 7.5 (GLOVE) ×2 IMPLANT
GLOVE BIOGEL PI INDICATOR 7.5 (GLOVE) ×2
GLOVE SS BIOGEL STRL SZ 7.5 (GLOVE) ×1 IMPLANT
GLOVE SUPERSENSE BIOGEL SZ 7.5 (GLOVE) ×1
GOWN PREVENTION PLUS LG XLONG (DISPOSABLE) ×2 IMPLANT
GOWN STRL REIN XL XLG (GOWN DISPOSABLE) ×2 IMPLANT
KIT BASIN OR (CUSTOM PROCEDURE TRAY) ×2 IMPLANT
KIT ROOM TURNOVER OR (KITS) ×2 IMPLANT
MANIFOLD NEPTUNE II (INSTRUMENTS) ×2 IMPLANT
NS IRRIG 1000ML POUR BTL (IV SOLUTION) ×2 IMPLANT
PACK TOTAL JOINT (CUSTOM PROCEDURE TRAY) ×2 IMPLANT
PAD ARMBOARD 7.5X6 YLW CONV (MISCELLANEOUS) ×4 IMPLANT
RTRCTR WOUND ALEXIS 18CM MED (MISCELLANEOUS) ×2
SPONGE LAP 18X18 X RAY DECT (DISPOSABLE) ×2 IMPLANT
SPONGE LAP 4X18 X RAY DECT (DISPOSABLE) IMPLANT
STAPLER VISISTAT 35W (STAPLE) ×2 IMPLANT
SUT ETHIBOND NAB CT1 #1 30IN (SUTURE) ×4 IMPLANT
SUT VIC AB 0 CT1 27 (SUTURE)
SUT VIC AB 0 CT1 27XBRD ANBCTR (SUTURE) IMPLANT
SUT VIC AB 1 CT1 27 (SUTURE) ×1
SUT VIC AB 1 CT1 27XBRD ANBCTR (SUTURE) ×1 IMPLANT
SUT VIC AB 2-0 CT1 27 (SUTURE) ×1
SUT VIC AB 2-0 CT1 TAPERPNT 27 (SUTURE) ×1 IMPLANT
SUT VLOC 180 0 24IN GS25 (SUTURE) IMPLANT
TOWEL OR 17X24 6PK STRL BLUE (TOWEL DISPOSABLE) ×2 IMPLANT
TOWEL OR 17X26 10 PK STRL BLUE (TOWEL DISPOSABLE) ×4 IMPLANT
TRAY FOLEY CATH 14FR (SET/KITS/TRAYS/PACK) IMPLANT
WATER STERILE IRR 1000ML POUR (IV SOLUTION) ×4 IMPLANT

## 2012-08-21 NOTE — Brief Op Note (Signed)
08/21/2012  9:05 AM  PATIENT:  Colton Montgomery  72 y.o. male  PRE-OPERATIVE DIAGNOSIS:  DJD LEFT HIP  POST-OPERATIVE DIAGNOSIS:  DJD LEFT HIP  PROCEDURE:  Procedure(s): TOTAL HIP ARTHROPLASTY ANTERIOR APPROACH (Left)  SURGEON:  Surgeon(s) and Role:    * Nilda Simmer, MD - Assisting    * Kathryne Hitch, MD - Primary   ANESTHESIA:   general  EBL:  Total I/O In: 1500 [I.V.:1000; IV Piggyback:500] Out: 500 [Blood:500]  BLOOD ADMINISTERED:none  DRAINS: none   LOCAL MEDICATIONS USED:  NONE  SPECIMEN:  No Specimen  DISPOSITION OF SPECIMEN:  N/A  COUNTS:  YES  TOURNIQUET:  * No tourniquets in log *  DICTATION: .Other Dictation: Dictation Number [id number]  PLAN OF CARE: Admit to inpatient   PATIENT DISPOSITION:  PACU - hemodynamically stable.   Delay start of Pharmacological VTE agent (>24hrs) due to surgical blood loss or risk of bleeding: no

## 2012-08-21 NOTE — Anesthesia Postprocedure Evaluation (Signed)
  Anesthesia Post-op Note  Patient: Colton Montgomery  Procedure(s) Performed: Procedure(s): TOTAL HIP ARTHROPLASTY ANTERIOR APPROACH (Left)  Patient Location: PACU  Anesthesia Type:General  Level of Consciousness: awake, alert , oriented and patient cooperative  Airway and Oxygen Therapy: Patient Spontanous Breathing and Patient connected to nasal cannula oxygen  Post-op Pain: mild  Post-op Assessment: Post-op Vital signs reviewed, Patient's Cardiovascular Status Stable, Respiratory Function Stable, Patent Airway, No signs of Nausea or vomiting and Pain level controlled  Post-op Vital Signs: Reviewed and stable  Complications: No apparent anesthesia complications

## 2012-08-21 NOTE — Preoperative (Signed)
Beta Blockers   Reason not to administer Beta Blockers:Not Applicable 

## 2012-08-21 NOTE — Progress Notes (Signed)
UR COMPLETED  

## 2012-08-21 NOTE — Op Note (Signed)
NAMEDARRILL, VREELAND               ACCOUNT NO.:  [id number]  MEDICAL RECORD NO.:  [id number]  LOCATION:  [facility]                         FACILITY:  [facility]  PHYSICIAN:  Vanita Panda. Magnus Ivan, M.D.DATE OF BIRTH:  Oct 20, 1940  DATE OF PROCEDURE:  08/21/2012 DATE OF DISCHARGE:                              OPERATIVE REPORT   PREOPERATIVE DIAGNOSIS:  Severe end-stage arthritis and degenerative joint disease with femoral head collapse, left hip.  POSTOPERATIVE DIAGNOSIS:  Severe end-stage arthritis and degenerative joint disease with femoral head collapse, left hip.  PROCEDURE:  Left total hip arthroplasty through direct anterior approach.  IMPLANTS:  DePuy Sector Gription acetabular component size 54, size 36+ 4 neutral polyethylene liner, size 16 Corail femoral component with varus offset, size 36+ 8.5 ceramic hip ball.  SURGEON:  Doneen Poisson, MD  ASSISTANT:  Elana Alm. Thurston Hole, MD  ANESTHESIA:  General.  BLOOD LOSS:  Less than 500 mL.  ANTIBIOTICS:  2 g IV Ancef.  COMPLICATIONS:  None.  INDICATIONS:  Mr. Uddin is a 72 year old very active individual with severe end-stage arthritis of both his hips, with now collapse of left hip femoral head.  This has affected his knee and his back.  At this point, he wished to proceed with a total hip arthroplasty.  With failure of conservative treatment, I agree with this.  We have talked to him about this in length and he does wish to proceed with a direct anterior hip surgery.  PROCEDURE DESCRIPTION:  After informed consent was obtained, appropriate left hip was marked.  He was brought to the operating room.  General anesthesia was obtained while he was on the stretcher.  Foley catheter was placed and both feet had traction boots applied to them.  He was then placed supine on the Hana fracture table with perineal post in place in both feet in inline skeletal traction, but no traction applied. His left hip was prepped and draped  with DuraPrep and sterile drapes. Time-out was called and he was identified as correct patient and correct left hip.  I then made an incision just posterior and inferior to the anterior-superior iliac spine and dissected down to the tensor fascia lata muscle.  The tensor fascia was then divided longitudinally and we proceeded with a direct anterior approach to the hip.  A Cobra retractor was placed around the lateral neck and up underneath the rectus femoris, a medial retractor was placed.  I cauterized the lateral femoral circumflex vessels.  I then opened up the hip capsule in a L-type format placing the hip retractors and the Cobra retractors within the hip capsule.  We found the collapse of the femoral head.  I made my femoral neck cut with an oscillating saw just proximal to lesser trochanter and finished this off with an osteotome.  We placed a corkscrew guide in the femoral head and removed the femoral head in its entirety.  We then placed a bent Hohmann medially and a Cobra retractor laterally.  We cleaned the acetabulum debris and then began reaming from a size 42 reamer all the way up to the size 54 in 2 mm increments with all reamers placed under direct visualization, the last reamer  was also placed under direct fluoroscopy, so we could obtain our depth of reaming, our inclination, and anteversion.  Once I was pleased with this, we placed the real DePuy Sector Gription acetabular component size 54 and the apex hole eliminator guide as well as the real 36+ 4 neutral polyethylene liner.  Attention was then turned to the femur.  With the leg externally rotated to 90 degrees, extended and adducted, we were able to gain access to the femoral canal using a box cutting guide and a rongeur.  We then began broaching from a size 8 Corail broach up to a size 16, 16 was felt to be stable, so we trialed a standard neck followed by varus neck __________ we could obtain our offset, our length,  and our stability with internal and external rotation.  We then removed all trial components and placed the real Corail femoral component size 16 with varus offset and the real 36+ 8.5 ceramic hip ball, reduced this back in the acetabulum and again it was stable.  We then closed the joint capsule with #1 Ethibond suture.  We copiously irrigated the hip joint with normal saline solution using pulsatile lavage.  We closed the tensor fascia with running #1 Vicryl suture followed by 2-0 Vicryl in the subcutaneous tissue, 4-0 Monocryl subcuticular stitch, Dermabond on the skin and Aquacel dressing.  He was then taken off the Hana table, awakened, extubated, and taken to recovery room in stable condition. All final counts were correct.  There were no complications noted.     Vanita Panda. Magnus Ivan, M.D.     CYB/MEDQ  D:  08/21/2012  T:  08/21/2012  Job:  [id number]

## 2012-08-21 NOTE — Evaluation (Signed)
Physical Therapy Evaluation Patient Details Name: Colton Montgomery MRN: [id number] DOB: 1940/12/04 Today's Date: 08/21/2012 Time: 1520-1600 PT Time Calculation (min): 40 min  PT Assessment / Plan / Recommendation Clinical Impression  Patient is a 72 y.o. male admitted s/p left direct anterior THA due to severe OA and pain.  Presents with decreased activity tolerance due to acute pain, decreased strength and AROM left LE all limiting independence with mobilty and he will benefit from skilled PT in the acute setting to maximize independence and allow return home after STSNF stay,.    PT Assessment  Patient needs continued PT services    Follow Up Recommendations  SNF    Does the patient have the potential to tolerate intense rehabilitation    N/A  Barriers to Discharge Decreased caregiver support      Equipment Recommendations  Rolling walker with 5" wheels    Recommendations for Other Services   None  Frequency 7X/week    Precautions / Restrictions Precautions Precautions: Fall Restrictions Weight Bearing Restrictions: Yes LLE Weight Bearing: Weight bearing as tolerated   Pertinent Vitals/Pain Left LE 8/10 RN delivered meds in session      Mobility  Bed Mobility Bed Mobility: Supine to Sit;Sit to Supine;Scooting to HOB Supine to Sit: 4: Min assist Sit to Supine: 4: Min assist;HOB flat Scooting to HOB: 4: Min assist;With rail Details for Bed Mobility Assistance: assist for left LE Transfers Transfers: Sit to Stand;Stand to Sit Sit to Stand: From bed;4: Min guard Stand to Sit: 4: Min assist;To toilet Details for Transfer Assistance: cues for hand placement and leg out prior to sitting Ambulation/Gait Ambulation/Gait Assistance: 4: Min assist Ambulation Distance (Feet): 2 Feet Assistive device: Rolling walker Ambulation/Gait Assistance Details: side steps to head of bed prior to sitting Gait Pattern: Antalgic    Exercises Total Joint Exercises Ankle  Circles/Pumps: AROM;5 reps;Both;Supine Quad Sets: AROM;Left;5 reps;Supine   PT Diagnosis: Difficulty walking;Acute pain  PT Problem List: Decreased strength;Decreased activity tolerance;Decreased balance;Decreased mobility;Pain;Decreased knowledge of use of DME PT Treatment Interventions: DME instruction;Balance training;Gait training;Stair training;Patient/family education;Functional mobility training;Therapeutic activities;Therapeutic exercise   PT Goals Acute Rehab PT Goals PT Goal Formulation: With patient Time For Goal Achievement: 09/04/12 Potential to Achieve Goals: Good Pt will go Supine/Side to Sit: with modified independence PT Goal: Supine/Side to Sit - Progress: Goal set today Pt will go Sit to Stand: with modified independence PT Goal: Sit to Stand - Progress: Goal set today Pt will Stand: with modified independence;6 - 10 min;with unilateral upper extremity support PT Goal: Stand - Progress: Goal set today Pt will Ambulate: >150 feet;with modified independence;with rolling walker PT Goal: Ambulate - Progress: Goal set today Pt will Go Up / Down Stairs: Flight;with supervision;with rail(s) PT Goal: Up/Down Stairs - Progress: Goal set today Pt will Perform Home Exercise Program: Independently PT Goal: Perform Home Exercise Program - Progress: Goal set today  Visit Information  Last PT Received On: 08/21/12 Assistance Needed: +1    Subjective Data  Subjective: I've been so cold. Patient Stated Goal: To go to [facility] for rehab   Prior Functioning  Home Living Lives With: Alone Available Help at Discharge: Skilled Nursing Facility Type of Home: House Home Access: Level entry Home Layout: Two level;Bed/bath upstairs Alternate Level Stairs-Number of Steps: 13 Alternate Level Stairs-Rails: Left Bathroom Shower/Tub: Tub/shower unit;Curtain Bathroom Toilet: Standard Home Adaptive Equipment: Straight cane Prior Function Level of Independence: Independent with  assistive device(s) Able to Take Stairs?: Yes Driving: Yes Vocation: Full time  employment Communication Communication: No difficulties Dominant Hand: Right    Cognition  Cognition Arousal/Alertness: Awake/alert Behavior During Therapy: WFL for tasks assessed/performed Overall Cognitive Status: Within Functional Limits for tasks assessed    Extremity/Trunk Assessment Right Lower Extremity Assessment RLE ROM/Strength/Tone: WFL for tasks assessed Left Lower Extremity Assessment LLE ROM/Strength/Tone: Deficits;Due to pain LLE ROM/Strength/Tone Deficits: Lifts antigravity, but with pain LLE Sensation: WFL - Light Touch   Balance Balance Balance Assessed: Yes Static Standing Balance Static Standing - Balance Support: Bilateral upper extremity supported Static Standing - Level of Assistance: 5: Stand by assistance Static Standing - Comment/# of Minutes: stood 3 min with testing weight on left LE  End of Session PT - End of Session Equipment Utilized During Treatment: Gait belt Activity Tolerance: Patient tolerated treatment well Patient left: in bed;with call bell/phone within reach  GP     [facility] 08/21/2012, 4:30 PM [place], [place] [phone] 08/21/2012

## 2012-08-21 NOTE — Anesthesia Preprocedure Evaluation (Addendum)
Anesthesia Evaluation  Patient identified by MRN, date of birth, ID band Patient awake    Reviewed: Allergy & Precautions, H&P , NPO status , Patient's Chart, lab work & pertinent test results  History of Anesthesia Complications Negative for: history of anesthetic complications  Airway Mallampati: II TM Distance: >3 FB Neck ROM: Full    Dental  (+) Dental Advisory Given, Implants and Caps   Pulmonary former smoker (quit 2000),  H/o laryngeal injury requiring multiple operations, chronic hoarseness breath sounds clear to auscultation  Pulmonary exam normal       Cardiovascular hypertension, Pt. on medications and Pt. on home beta blockers - CAD + dysrhythmias (s/p ablation '12) Atrial Fibrillation Rhythm:Regular Rate:Normal  '10 stress test: normal perfusion 1/14 ECHO: mildly reduced LVF, EF 45-50%, valves OK   Neuro/Psych negative neurological ROS     GI/Hepatic negative GI ROS, Neg liver ROS,   Endo/Other  Hypothyroidism   Renal/GU Renal diseaseHas had hydronephrosis with renal stent in past     Musculoskeletal   Abdominal   Peds  Hematology   Anesthesia Other Findings   Reproductive/Obstetrics                        Anesthesia Physical Anesthesia Plan  ASA: III  Anesthesia Plan:    Post-op Pain Management:    Induction: Intravenous  Airway Management Planned: Oral ETT  Additional Equipment:   Intra-op Plan:   Post-operative Plan: Extubation in OR  Informed Consent: I have reviewed the patients History and Physical, chart, labs and discussed the procedure including the risks, benefits and alternatives for the proposed anesthesia with the patient or authorized representative who has indicated his/her understanding and acceptance.   Dental advisory given  Plan Discussed with: CRNA, Surgeon and Anesthesiologist  Anesthesia Plan Comments: (Plan routine monitors, GETA)        Anesthesia Quick Evaluation

## 2012-08-21 NOTE — Interval H&P Note (Signed)
History and Physical Interval Note:  08/21/2012 7:08 AM  Colton Montgomery  has presented today for surgery, with the diagnosis of DJD LEFT HIP  The various methods of treatment have been discussed with the patient and family. After consideration of risks, benefits and other options for treatment, the patient has consented to  Procedure(s): TOTAL HIP ARTHROPLASTY ANTERIOR APPROACH (Left) as a surgical intervention .  The patient's history has been reviewed, patient examined, no change in status, stable for surgery.  I have reviewed the patient's chart and labs.  Questions were answered to the patient's satisfaction.     Salvatore Marvel A

## 2012-08-21 NOTE — Anesthesia Procedure Notes (Signed)
Procedure Name: Intubation Date/Time: 08/21/2012 7:25 AM Performed by: Rogelia Boga Pre-anesthesia Checklist: Patient identified, Emergency Drugs available, Suction available, Patient being monitored and Timeout performed Patient Re-evaluated:Patient Re-evaluated prior to inductionOxygen Delivery Method: Circle system utilized Preoxygenation: Pre-oxygenation with 100% oxygen Intubation Type: IV induction Ventilation: Mask ventilation without difficulty Laryngoscope Size: Mac and 4 Grade View: Grade I Tube type: Oral Tube size: 7.0 mm Number of attempts: 1 Airway Equipment and Method: Stylet Placement Confirmation: ETT inserted through vocal cords under direct vision,  positive ETCO2 and breath sounds checked- equal and bilateral Secured at: 22 cm Tube secured with: Tape Dental Injury: Teeth and Oropharynx as per pre-operative assessment
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
## 2012-08-21 NOTE — Transfer of Care (Signed)
Immediate Anesthesia Transfer of Care Note  Patient: Colton Montgomery  Procedure(s) Performed: Procedure(s): TOTAL HIP ARTHROPLASTY ANTERIOR APPROACH (Left)  Patient Location: PACU  Anesthesia Type:General  Level of Consciousness: awake, alert , oriented and patient cooperative  Airway & Oxygen Therapy: Patient Spontanous Breathing and Patient connected to nasal cannula oxygen  Post-op Assessment: Report given to PACU RN, Post -op Vital signs reviewed and stable and Patient moving all extremities X 4  Post vital signs: Reviewed and stable  Complications: No apparent anesthesia complications

## 2012-08-22 ENCOUNTER — Encounter (HOSPITAL_COMMUNITY): Payer: Self-pay | Admitting: Orthopedic Surgery

## 2012-08-22 DIAGNOSIS — M169 Osteoarthritis of hip, unspecified: Secondary | ICD-10-CM

## 2012-08-22 HISTORY — DX: Osteoarthritis of hip, unspecified: M16.9

## 2012-08-22 LAB — CBC
HCT: 34.5 % — ABNORMAL LOW (ref 39.0–52.0)
Hemoglobin: 11.8 g/dL — ABNORMAL LOW (ref 13.0–17.0)
MCH: 30 pg (ref 26.0–34.0)
MCHC: 34.2 g/dL (ref 30.0–36.0)
MCV: 87.8 fL (ref 78.0–100.0)
Platelets: 160 10*3/uL (ref 150–400)
RBC: 3.93 MIL/uL — ABNORMAL LOW (ref 4.22–5.81)
RDW: 13.2 % (ref 11.5–15.5)
WBC: 11.3 10*3/uL — ABNORMAL HIGH (ref 4.0–10.5)

## 2012-08-22 LAB — BASIC METABOLIC PANEL
BUN: 14 mg/dL (ref 6–23)
CO2: 25 mEq/L (ref 19–32)
Calcium: 8.4 mg/dL (ref 8.4–10.5)
Chloride: 98 mEq/L (ref 96–112)
Creatinine, Ser: 0.73 mg/dL (ref 0.50–1.35)
GFR calc Af Amer: >90 mL/min (ref >90)
GFR calc non Af Amer: >90 mL/min (ref >90)
Glucose, Bld: 162 mg/dL — ABNORMAL HIGH (ref 70–99)
Potassium: 3.8 mEq/L (ref 3.5–5.1)
Sodium: 133 mEq/L — ABNORMAL LOW (ref 135–145)

## 2012-08-22 MED ORDER — TAMSULOSIN HCL 0.4 MG PO CAPS
0.4000 mg | ORAL_CAPSULE | Freq: Every day | ORAL | Status: DC
  Administered 2012-08-22 – 2012-08-24 (×3): 0.4 mg via ORAL
  Filled 2012-08-22 (×3): qty 1

## 2012-08-22 NOTE — Clinical Social Work Placement (Addendum)
Clinical Social Work Department  CLINICAL SOCIAL WORK PLACEMENT NOTE  08/22/2012  Patient: Colton Montgomery Account Number: [id number]  Admit date: 08/21/12  Clinical Social Worker: Sabino Niemann MSW Date/time: 08/22/2012 2:30 PM  Clinical Social Work is seeking post-discharge placement for this patient at the following level of care: SKILLED NURSING (*CSW will update this form in Epic as items are completed)  08/22/2012 Patient/family provided with [facility] Department of Clinical Social Work's list of facilities offering this level of care within the geographic area requested by the patient (or if unable, by the patient's family).  08/22/2012 Patient/family informed of their freedom to choose among providers that offer the needed level of care, that participate in Medicare, Medicaid or managed care program needed by the patient, have an available bed and are willing to accept the patient.  08/22/2012 Patient/family informed of [facility]' ownership interest in [facility], as well as of the fact that they are under no obligation to receive care at this facility.  PASARR submitted to EDS on  PASARR number received from EDS on  FL2 transmitted to all facilities in geographic area requested by pt/family on 08/22/2012  FL2 transmitted to all facilities within larger geographic area on  Patient informed that his/her managed care company has contracts with or will negotiate with certain facilities, including the following:  Patient/family informed of bed offers received:  Patient chooses bed at  Physician recommends and patient chooses bed at  Patient to be transferred to on  Patient to be transferred to facility by  The following physician request were entered in Epic:  Additional Comments:

## 2012-08-22 NOTE — Evaluation (Signed)
Occupational Therapy Evaluation Patient Details Name: Colton Montgomery MRN: [id number] DOB: May 29, 1940 Today's Date: 08/22/2012 Time: 1025-   1049  OT Assessment / Plan / Recommendation Clinical Impression  Pt presents to OT s/p LTHA. Pt will benefit from skilled OT to increase I with ADL activity and return to PLOF    OT Assessment  Patient needs continued OT Services    Follow Up Recommendations  SNF       Equipment Recommendations  None recommended by OT       Frequency  Min 2X/week    Precautions / Restrictions Precautions Precautions: Fall Restrictions Weight Bearing Restrictions: Yes LLE Weight Bearing: Weight bearing as tolerated       ADL  Grooming: Performed;Wash/dry face;Teeth care;Supervision/safety Where Assessed - Grooming: Unsupported standing Toilet Transfer: Performed;Min guard Statistician Method: Sit to Barista: Comfort height toilet;Grab bars Toileting - Clothing Manipulation and Hygiene: Performed;Min guard Where Assessed - Engineer, mining and Hygiene: Standing Transfers/Ambulation Related to ADLs: Pt declined to try LB dressing     OT Problem List: Decreased strength;Pain;Decreased activity tolerance OT Treatment Interventions: Self-care/ADL training;Patient/family education   OT Goals Acute Rehab OT Goals OT Goal Formulation: With patient Time For Goal Achievement: 09/05/12 Potential to Achieve Goals: Good ADL Goals Pt Will Perform Grooming: with modified independence;Standing at sink ADL Goal: Grooming - Progress: Goal set today Pt Will Perform Lower Body Dressing: with modified independence;Sit to stand from chair ADL Goal: Lower Body Dressing - Progress: Goal set today Pt Will Transfer to Toilet: with modified independence;Comfort height toilet ADL Goal: Toilet Transfer - Progress: Goal set today Pt Will Perform Toileting - Clothing Manipulation: Standing;with modified independence ADL Goal:  Toileting - Clothing Manipulation - Progress: Goal set today  Visit Information  Last OT Received On: 08/22/12 Assistance Needed: +1    Subjective Data  Subjective: i dont want to do anything but i will   Prior Functioning     Home Living Lives With: Alone Available Help at Discharge: Skilled Nursing Facility Type of Home: House Home Access: Level entry Home Layout: Two level;Bed/bath upstairs Alternate Level Stairs-Number of Steps: 13 Alternate Level Stairs-Rails: Left Bathroom Shower/Tub: Tub/shower unit;Curtain Bathroom Toilet: Standard Home Adaptive Equipment: Straight cane Prior Function Level of Independence: Independent with assistive device(s) Able to Take Stairs?: Yes Driving: Yes Vocation: Full time employment Communication Communication: No difficulties Dominant Hand: Right         Vision/Perception Vision - Assessment Eye Alignment: Within Functional Limits      Extremity/Trunk Assessment Right Upper Extremity Assessment RUE ROM/Strength/Tone: Within functional levels Left Upper Extremity Assessment LUE ROM/Strength/Tone: Within functional levels     Mobility Bed Mobility Bed Mobility: Supine to Sit;Sit to Supine;Scooting to HOB Supine to Sit: 4: Min assist Sit to Supine: 4: Min assist;HOB flat Scooting to HOB: 4: Min assist;With rail Details for Bed Mobility Assistance: assist for left LE Transfers Sit to Stand: From bed;4: Min guard Stand to Sit: 4: Min assist;To toilet Details for Transfer Assistance: cues for hand placement and leg out prior to sitting           End of Session OT - End of Session Activity Tolerance: Patient tolerated treatment well Patient left: in bed;with call bell/phone within reach  GO     [facility] [place], [place] 08/22/2012, 10:49 AM

## 2012-08-22 NOTE — Progress Notes (Signed)
Physical Therapy Treatment Patient Details Name: Colton Montgomery MRN: [id number] DOB: [birth year] Today's Date: 08/22/2012 Time: 3086-5784 PT Time Calculation (min): 24 min  PT Assessment / Plan / Recommendation Comments on Treatment Session  Patient s/p R THA with no precautions. Progressing with ambulation. Increased time for safety and education. Patient will [facility] prior to returning home alone    Follow Up Recommendations  SNF     Does the patient have the potential to tolerate intense rehabilitation     Barriers to Discharge        Equipment Recommendations  Rolling walker with 5" wheels    Recommendations for Other Services    Frequency 7X/week   Plan Discharge plan remains appropriate;Frequency remains appropriate    Precautions / Restrictions Precautions Precautions: Fall Restrictions Weight Bearing Restrictions: Yes LLE Weight Bearing: Weight bearing as tolerated   Pertinent Vitals/Pain 5/10 R LE pain. RN provided medication to assist with pain control     Mobility  Bed Mobility Bed Mobility: Supine to Sit;Sit to Supine;Scooting to HOB Supine to Sit: 4: Min assist Sit to Supine: 4: Min assist;HOB flat Scooting to HOB: 4: Min assist;With rail Details for Bed Mobility Assistance: assist for left LE Transfers Sit to Stand: From bed;4: Min guard;With upper extremity assist Stand to Sit: 4: Min assist;To toilet;With upper extremity assist Details for Transfer Assistance: cues for hand placement and leg out prior to sitting Ambulation/Gait Ambulation/Gait Assistance: 4: Min guard Ambulation Distance (Feet): 100 Feet Assistive device: Rolling walker Ambulation/Gait Assistance Details: Cues for RW safety and management. Patient unsafe with turns and impulsive Gait Pattern: Step-to pattern    Exercises     PT Diagnosis:    PT Problem List:   PT Treatment Interventions:     PT Goals Acute Rehab PT Goals PT Goal: Supine/Side to Sit -  Progress: Progressing toward goal PT Goal: Sit to Stand - Progress: Progressing toward goal PT Goal: Stand - Progress: Progressing toward goal PT Goal: Ambulate - Progress: Progressing toward goal  Visit Information  Last PT Received On: 08/22/12 Assistance Needed: +1    Subjective Data      Cognition  Cognition Arousal/Alertness: Awake/alert Behavior During Therapy: WFL for tasks assessed/performed Overall Cognitive Status: Within Functional Limits for tasks assessed    Balance     End of Session PT - End of Session Equipment Utilized During Treatment: Gait belt Activity Tolerance: Patient tolerated treatment well Patient left: in bed;with call bell/phone within reach   GP     Fredrich Birks 08/22/2012, 11:39 AM 08/22/2012 Fredrich Birks PTA [phone] pager [phone] office

## 2012-08-22 NOTE — Progress Notes (Signed)
Subjective: 1 Day Post-Op Procedure(s) (LRB): TOTAL HIP ARTHROPLASTY ANTERIOR APPROACH (Left) Patient reports pain as moderate.  Already up ambulating this am.  Minimal acute blood loss anemia, but asymptomatic.  Objective: Vital signs in last 24 hours: Temp:  [96.9 F (36.1 C)-99.8 F (37.7 C)] 98.7 F (37.1 C) (06/24 0647) Pulse Rate:  [55-86] 78 (06/24 0647) Resp:  [11-18] 18 (06/24 0647) BP: (102-139)/(52-75) 128/63 mmHg (06/24 0647) SpO2:  [98 %-100 %] 98 % (06/24 0647)  Intake/Output from previous day: 06/23 0701 - 06/24 0700 In: 3880 [P.O.:480; I.V.:2900; IV Piggyback:500] Out: 2500 [Urine:2000; Blood:500] Intake/Output this shift:     Recent Labs  08/22/12 0440  HGB 11.8*    Recent Labs  08/22/12 0440  WBC 11.3*  RBC 3.93*  HCT 34.5*  PLT 160    Recent Labs  08/22/12 0440  NA 133*  K 3.8  CL 98  CO2 25  BUN 14  CREATININE 0.73  GLUCOSE 162*  CALCIUM 8.4   No results found for this basename: LABPT, INR,  in the last 72 hours  Sensation intact distally Intact pulses distally Dorsiflexion/Plantar flexion intact Incision: dressing C/D/I  Assessment/Plan: 1 Day Post-Op Procedure(s) (LRB): TOTAL HIP ARTHROPLASTY ANTERIOR APPROACH (Left) Up with therapy, WBAT left hip, no precautions. Will need ST-SNF placement [facility]) later this week.  Elmyra Banwart Y 08/22/2012, 7:16 AM

## 2012-08-22 NOTE — Progress Notes (Signed)
Clinical Social Work Department  BRIEF PSYCHOSOCIAL ASSESSMENT  Patient: Colton Montgomery Account [id number]  Admit date: 08/21/12 Clinical Social Worker Colton Montgomery, MSW Date/Time:  Referred by: Physician Date Referred:  Referred for   SNF Placement   Other Referral:  Interview type: Patient  Other interview type: PSYCHOSOCIAL DATA  Living Status: with Spouse Admitted from facility:  Level of care:  Primary support name: Colton Montgomery  Primary support relationship to patient: Spouse Degree of support available:  Strong and vested  CURRENT CONCERNS  Current Concerns   Post-Acute Placement   Other Concerns:  SOCIAL WORK ASSESSMENT / PLAN  CSW met with pt re: PT recommendation for SNF.   Pt lives with his spouse  CSW explained placement process and answered questions.   Pt reports he pre-registered at [facility]     CSW completed FL2 and sent clinicals to [facility]     Assessment/plan status: Information/Referral to [facility]  Other assessment/ plan:  Information/referral to community resources:  SNF     PATIENT'S/FAMILY'S RESPONSE TO PLAN OF CARE:  Pt  reports he is agreeable to ST SNF in order to increase strength and independence with mobility prior to returning home  Pt verbalized understanding of placement process and appreciation for CSW assist.   Colton Montgomery, MSW [phone]

## 2012-08-23 DIAGNOSIS — R339 Retention of urine, unspecified: Secondary | ICD-10-CM | POA: Diagnosis not present

## 2012-08-23 LAB — BASIC METABOLIC PANEL
BUN: 22 mg/dL (ref 6–23)
CO2: 29 mEq/L (ref 19–32)
Calcium: 9.1 mg/dL (ref 8.4–10.5)
Chloride: 95 mEq/L — ABNORMAL LOW (ref 96–112)
Creatinine, Ser: 0.95 mg/dL (ref 0.50–1.35)
GFR calc Af Amer: >90 mL/min (ref >90)
GFR calc non Af Amer: 82 mL/min — ABNORMAL LOW (ref >90)
Glucose, Bld: 118 mg/dL — ABNORMAL HIGH (ref 70–99)
Potassium: 4.1 mEq/L (ref 3.5–5.1)
Sodium: 129 mEq/L — ABNORMAL LOW (ref 135–145)

## 2012-08-23 LAB — CBC
HCT: 34.1 % — ABNORMAL LOW (ref 39.0–52.0)
Hemoglobin: 11.6 g/dL — ABNORMAL LOW (ref 13.0–17.0)
MCH: 30.1 pg (ref 26.0–34.0)
MCHC: 34 g/dL (ref 30.0–36.0)
MCV: 88.3 fL (ref 78.0–100.0)
Platelets: 134 10*3/uL — ABNORMAL LOW (ref 150–400)
RBC: 3.86 MIL/uL — ABNORMAL LOW (ref 4.22–5.81)
RDW: 13.4 % (ref 11.5–15.5)
WBC: 13.9 10*3/uL — ABNORMAL HIGH (ref 4.0–10.5)

## 2012-08-23 MED ORDER — BETHANECHOL CHLORIDE 25 MG PO TABS
25.0000 mg | ORAL_TABLET | Freq: Four times a day (QID) | ORAL | Status: DC
  Administered 2012-08-23 – 2012-08-24 (×2): 25 mg via ORAL
  Filled 2012-08-23 (×9): qty 1

## 2012-08-23 MED ORDER — SODIUM CHLORIDE 0.9 % IV BOLUS (SEPSIS): 500.0000 mL | Freq: Once | INTRAVENOUS | Status: DC

## 2012-08-23 MED ORDER — PANTOPRAZOLE SODIUM 40 MG PO TBEC
40.0000 mg | DELAYED_RELEASE_TABLET | Freq: Every day | ORAL | Status: DC
  Administered 2012-08-23: 40 mg via ORAL
  Filled 2012-08-23: qty 1

## 2012-08-23 MED ORDER — SODIUM CHLORIDE 0.9 % IV BOLUS (SEPSIS)
500.0000 mL | Freq: Once | INTRAVENOUS | Status: AC
  Administered 2012-08-23: 500 mL via INTRAVENOUS

## 2012-08-23 NOTE — Progress Notes (Signed)
Subjective: 2 Days Post-Op Procedure(s) (LRB): TOTAL HIP ARTHROPLASTY ANTERIOR APPROACH (Left) Patient reports pain as moderate.  Working with therapy.  Objective: Vital signs in last 24 hours: Temp:  [98.3 F (36.8 C)-98.6 F (37 C)] 98.3 F (36.8 C) (06/25 0530) Pulse Rate:  [80-102] 84 (06/25 0530) Resp:  [18] 18 (06/25 0530) BP: (97-115)/(61-62) 102/61 mmHg (06/25 0530) SpO2:  [95 %-98 %] 97 % (06/25 0530)  Intake/Output from previous day: 06/24 0701 - 06/25 0700 In: 240 [P.O.:240] Out: 775 [Urine:775] Intake/Output this shift:     Recent Labs  08/22/12 0440 08/23/12 0420  HGB 11.8* 11.6*    Recent Labs  08/22/12 0440 08/23/12 0420  WBC 11.3* 13.9*  RBC 3.93* 3.86*  HCT 34.5* 34.1*  PLT 160 134*    Recent Labs  08/22/12 0440 08/23/12 0420  NA 133* 129*  K 3.8 4.1  CL 98 95*  CO2 25 29  BUN 14 22  CREATININE 0.73 0.95  GLUCOSE 162* 118*  CALCIUM 8.4 9.1   No results found for this basename: LABPT, INR,  in the last 72 hours  Sensation intact distally Intact pulses distally Dorsiflexion/Plantar flexion intact Incision: dressing C/D/I  Assessment/Plan: 2 Days Post-Op Procedure(s) (LRB): TOTAL HIP ARTHROPLASTY ANTERIOR APPROACH (Left) Up with therapy Discharge to SNF tomorrow.  Kathryne Hitch 08/23/2012, 7:42 AM

## 2012-08-23 NOTE — Progress Notes (Signed)
Subjective: 2 Days Post-Op Procedure(s) (LRB): TOTAL HIP ARTHROPLASTY ANTERIOR APPROACH (Left) Patient reports pain as 6 on 0-10 scale.    Objective: Vital signs in last 24 hours: Temp:  [98.3 F (36.8 C)-99.1 F (37.3 C)] 99.1 F (37.3 C) (06/25 1300) Pulse Rate:  [80-84] 83 (06/25 1300) Resp:  [18] 18 (06/25 1300) BP: (97-102)/(54-72) 102/54 mmHg (06/25 1300) SpO2:  [95 %-97 %] 97 % (06/25 1300)  Intake/Output from previous day: 06/24 0701 - 06/25 0700 In: 240 [P.O.:240] Out: 775 [Urine:775] Intake/Output this shift:     Recent Labs  08/22/12 0440 08/23/12 0420  HGB 11.8* 11.6*    Recent Labs  08/22/12 0440 08/23/12 0420  WBC 11.3* 13.9*  RBC 3.93* 3.86*  HCT 34.5* 34.1*  PLT 160 134*    Recent Labs  08/22/12 0440 08/23/12 0420  NA 133* 129*  K 3.8 4.1  CL 98 95*  CO2 25 29  BUN 14 22  CREATININE 0.73 0.95  GLUCOSE 162* 118*  CALCIUM 8.4 9.1   No results found for this basename: LABPT, INR,  in the last 72 hours  Neurologically intact ABD soft Sensation intact distally Intact pulses distally Dorsiflexion/Plantar flexion intact Incision: dressing C/D/I and scant drainage  Assessment/Plan: 2 Days Post-Op Procedure(s) (LRB): TOTAL HIP ARTHROPLASTY ANTERIOR APPROACH (Left) Urinary retention To SNF tomorrow Up with therapy  Janisha Bueso J 08/23/2012, 3:26 PM

## 2012-08-23 NOTE — Progress Notes (Signed)
Patient been voiding scant amount at a time since foley been d/c'd. Patient was started on Flomax daily, first dose last night. Bladder scan this morning at 0535 showed 675cc. MD on call notified, in and out cath order was given. In and out cath done with 675cc urine out. Patient instructed to notify RN when voided. Patient resting comfortably, will continue to monitor.

## 2012-08-23 NOTE — Progress Notes (Signed)
Pt continues to have urinary retention. PA aware and urecholine started per her order. Pt bladder scanned for 350 cc at 1300. Pt given NS bolus of 500 cc x 2 per PA order. Last NS bolus completed at 1750. Pt declines bladder scan and/or in and out cath. Pt has been able to void 50 cc only this shift. Pt states, "I will go". Will rept to oncoming shift for them to continue to assess.

## 2012-08-23 NOTE — Progress Notes (Signed)
Physical Therapy Treatment Patient Details Name: Colton Montgomery MRN: [id number] DOB: 1940-05-05 Today's Date: 08/23/2012 Time: 2130-8657 PT Time Calculation (min): 15 min  PT Assessment / Plan / Recommendation  PT Comments   Patient continues to be unsafe to discharge home alone due to safety and decreased ability to take care of himself and decreased functional mobility and will benefit from continued post-acute therapy. Currently recommending skilled nursing facility for increased rehab   Follow Up Recommendations  SNF     Does the patient have the potential to tolerate intense rehabilitation     Barriers to Discharge        Equipment Recommendations  Rolling walker with 5" wheels    Recommendations for Other Services    Frequency 7X/week   Progress towards PT Goals    Plan Current plan remains appropriate    Precautions / Restrictions Precautions Precautions: Fall Restrictions LLE Weight Bearing: Weight bearing as tolerated   Pertinent Vitals/Pain no apparent distress     Mobility  Bed Mobility Bed Mobility: Not assessed Transfers Sit to Stand: 5: Supervision Stand to Sit: 5: Supervision Ambulation/Gait Ambulation/Gait Assistance: 5: Supervision Ambulation Distance (Feet): 200 Feet Assistive device: Rolling walker Ambulation/Gait Assistance Details: Cues for safety with RW management Gait Pattern: Step-to pattern    Exercises Total Joint Exercises Long Arc Quad: AROM;Left;10 reps   PT Diagnosis:    PT Problem List:   PT Treatment Interventions:     PT Goals    Visit Information  Last PT Received On: 08/23/12 Assistance Needed: +1    Subjective Data      Cognition  Cognition Arousal/Alertness: Awake/alert Behavior During Therapy: WFL for tasks assessed/performed Overall Cognitive Status: Within Functional Limits for tasks assessed    Balance     End of Session PT - End of Session Activity Tolerance: Patient tolerated treatment  well Patient left: in chair;with call bell/phone within reach Nurse Communication: Mobility status   GP     Fredrich Birks 08/23/2012, 9:22 AM 08/23/2012 Fredrich Birks PTA [phone] pager [phone] office

## 2012-08-24 LAB — CBC
HCT: 27.9 % — ABNORMAL LOW (ref 39.0–52.0)
Hemoglobin: 9.5 g/dL — ABNORMAL LOW (ref 13.0–17.0)
MCH: 30.1 pg (ref 26.0–34.0)
MCHC: 34.1 g/dL (ref 30.0–36.0)
MCV: 88.3 fL (ref 78.0–100.0)
Platelets: 131 10*3/uL — ABNORMAL LOW (ref 150–400)
RBC: 3.16 MIL/uL — ABNORMAL LOW (ref 4.22–5.81)
RDW: 13.2 % (ref 11.5–15.5)
WBC: 10.9 10*3/uL — ABNORMAL HIGH (ref 4.0–10.5)

## 2012-08-24 LAB — BASIC METABOLIC PANEL
BUN: 18 mg/dL (ref 6–23)
CO2: 26 mEq/L (ref 19–32)
Calcium: 8.5 mg/dL (ref 8.4–10.5)
Chloride: 99 mEq/L (ref 96–112)
Creatinine, Ser: 0.68 mg/dL (ref 0.50–1.35)
GFR calc Af Amer: >90 mL/min (ref >90)
GFR calc non Af Amer: >90 mL/min (ref >90)
Glucose, Bld: 110 mg/dL — ABNORMAL HIGH (ref 70–99)
Potassium: 3.7 mEq/L (ref 3.5–5.1)
Sodium: 131 mEq/L — ABNORMAL LOW (ref 135–145)

## 2012-08-24 MED ORDER — TAMSULOSIN HCL 0.4 MG PO CAPS: 0.4000 mg | ORAL_CAPSULE | Freq: Every day | ORAL | Status: DC

## 2012-08-24 MED ORDER — PANTOPRAZOLE SODIUM 40 MG PO TBEC: 40.0000 mg | DELAYED_RELEASE_TABLET | Freq: Every day | ORAL | Status: DC

## 2012-08-24 MED ORDER — DSS 100 MG PO CAPS: 100.0000 mg | ORAL_CAPSULE | Freq: Two times a day (BID) | ORAL | Status: DC

## 2012-08-24 MED ORDER — OXYCODONE HCL 5 MG PO TABS: ORAL_TABLET | ORAL | Status: DC

## 2012-08-24 MED ORDER — ASPIRIN 325 MG PO TBEC: 325.0000 mg | DELAYED_RELEASE_TABLET | Freq: Two times a day (BID) | ORAL | Status: DC

## 2012-08-24 NOTE — Progress Notes (Signed)
Agree with change in follow up recommendations due to D/C disposition change. [place], [place], [place] [phone]

## 2012-08-24 NOTE — Plan of Care (Signed)
Problem: Discharge Progression Outcomes Goal: Negotiates stairs To see PT for stairs prior to D/C.

## 2012-08-24 NOTE — Discharge Summary (Signed)
Patient ID: IRL BODIE MRN: [id number] DOB/AGE: [birth year] 72 y.o.  Admit date: 08/21/2012 Discharge date: 08/24/2012  Admission Diagnoses:  Principal Problem:   DJD (degenerative joint disease) of left hip Active Problems:   History of atrial fibrillation   S/P ablation of atrial fibrillation   Hypothyroidism   RBBB   Hoarseness, chronic   Hydronephrosis, right   Kidney filling defect   PVC's (premature ventricular contractions)   S/P total knee arthroplasty   Retention of urine   Discharge Diagnoses:  Same  Past Medical History  Diagnosis Date  . History of atrial fibrillation   . S/P ablation of atrial fibrillation APRIL 2012  . Hypothyroidism   . Nonischemic cardiomyopathy PER CATH REPORT 2005    EF 20% 2005, 45-50% by echo 03/2012  . DJD (degenerative joint disease)   . RBBB   . Hoarseness, chronic SECONARY TO LARNYX INJURY 1997    NO AIRWAY ISSUES  . Hydronephrosis, right   . Hematuria   . Hypertension   . Hyperlipidemia   . Kidney filling defect RIGHT  . PVC's (premature ventricular contractions)   . S/P total knee arthroplasty 08/23/2005    left [place]  . S/P total knee arthroplasty 08/26/2004    right [place]  . Coronary artery disease CARDIOLOGIST-  [facility]    no significant CAD by 2005 cath, normal nuclear stress 2010    Surgeries: Procedure(s): TOTAL HIP ARTHROPLASTY ANTERIOR APPROACH on 08/21/2012   Consultants:    Discharged Condition: Improved  Hospital Course: JONATHON TAN is an 72 y.o. male who was admitted 08/21/2012 for operative treatment ofDJD (degenerative joint disease) of hip. Patient has severe unremitting pain that affects sleep, daily activities, and work/hobbies. After pre-op clearance the patient was taken to the operating room on 08/21/2012 and underwent  Procedure(s): TOTAL HIP ARTHROPLASTY ANTERIOR APPROACH.    Patient was given perioperative antibiotics: Anti-infectives   Start     Dose/Rate Route  Frequency Ordered Stop   08/21/12 1215  ceFAZolin (ANCEF) IVPB 2 g/50 mL premix     2 g 100 mL/hr over 30 Minutes Intravenous Every 6 hours 08/21/12 1206 08/22/12 0014   08/21/12 0600  ceFAZolin (ANCEF) IVPB 2 g/50 mL premix     2 g 100 mL/hr over 30 Minutes Intravenous On call to O.R. 08/20/12 1435 08/21/12 0728       Patient was given sequential compression devices, early ambulation, and chemoprophylaxis to prevent DVT.  Patient benefited maximally from hospital stay and there were no complications.    Recent vital signs: Patient Vitals for the past 24 hrs:  BP Temp Temp src Pulse Resp SpO2  08/24/12 1141 - - - - 18 -  08/24/12 0754 - - - - 16 -  08/24/12 0620 110/61 mmHg 98.6 F (37 C) Oral 82 18 98 %  08/23/12 1300 102/54 mmHg 99.1 F (37.3 C) - 83 18 97 %     Recent laboratory studies:  Recent Labs  08/23/12 0420 08/24/12 0435  WBC 13.9* 10.9*  HGB 11.6* 9.5*  HCT 34.1* 27.9*  PLT 134* 131*  NA 129* 131*  K 4.1 3.7  CL 95* 99  CO2 29 26  BUN 22 18  CREATININE 0.95 0.68  GLUCOSE 118* 110*  CALCIUM 9.1 8.5     Discharge Medications:     Medication List    STOP taking these medications       celecoxib 200 MG capsule  Commonly known as:  CELEBREX  HYDROcodone-acetaminophen 10-325 MG per tablet  Commonly known as:  NORCO     VIAGRA PO      TAKE these medications       aspirin 325 MG EC tablet  Take 1 tablet (325 mg total) by mouth 2 (two) times daily.     carvedilol 25 MG tablet  Commonly known as:  COREG  Take 0.5 tablets (12.5 mg total) by mouth 2 (two) times daily.     DSS 100 MG Caps  Take 100 mg by mouth 2 (two) times daily.     levothyroxine 50 MCG tablet  Commonly known as:  SYNTHROID, LEVOTHROID  Take 50 mcg by mouth daily before breakfast.     oxyCODONE 5 MG immediate release tablet  Commonly known as:  Oxy IR/ROXICODONE  1-2 tablets every 4-6 hrs as needed for pain     pantoprazole 40 MG tablet  Commonly known as:  PROTONIX   Take 1 tablet (40 mg total) by mouth daily.     ramipril 10 MG capsule  Commonly known as:  ALTACE  Take 1 capsule (10 mg total) by mouth every morning.     simvastatin 40 MG tablet  Commonly known as:  ZOCOR  Take 40 mg by mouth every morning.     tamsulosin 0.4 MG Caps  Commonly known as:  FLOMAX  Take 1 capsule (0.4 mg total) by mouth daily.        Diagnostic Studies: Dg Chest 2 View  08/07/2012   *RADIOLOGY REPORT*  Clinical Data: Preop radiograph.  DJD.  CHEST - 2 VIEW  Comparison: 01/08/11  Findings: Heart size is normal.  Lung volumes are low.  No pleural effusion or edema identified.  Multilevel spondylosis is noted within the thoracic spine. There is severe osteoarthritis involving the left glenohumeral joint.  IMPRESSION:  1.  No acute cardiopulmonary abnormalities. 2.  Thoracic spondylosis and advanced osteoarthritis involving the left shoulder.   Original Report Authenticated By: Signa Kell, M.D.   Dg Hip Operative Left  08/21/2012   *RADIOLOGY REPORT*  Clinical Data: Total hip arthroplasty.  OPERATIVE LEFT HIP  Comparison: None.  Findings: 2 intraoperative views.  These demonstrate a left hip arthroplasty without acute complication.  Artifact degradation on the initial film.  IMPRESSION: Intraoperative imaging of left hip arthroplasty.   Original Report Authenticated By: Jeronimo Greaves, M.D.   Dg Pelvis Portable  08/21/2012   *RADIOLOGY REPORT*  Clinical Data: Left hip replacement  PORTABLE PELVIS  Comparison: None.  Findings: There is been total hip replacement on the left. Acetabular femoral components appear well positioned and there is no radiographically detectable complication.  Advanced osteoarthritis noted on the right hip as well.  IMPRESSION: Good appearance following total hip replacement on the left.   Original Report Authenticated By: Paulina Fusi, M.D.   Dg Hip Portable 1 View Left  08/21/2012   *RADIOLOGY REPORT*  Clinical Data: Postop.  PORTABLE LEFT HIP - 1  VIEW  Comparison: 08/21/2012  Findings: The single cross-table lateral view is performed and compared to AP pelvis on the same day.  Patient has undergone hip arthroplasty.  The femoral head component is well seated.  No evidence for dislocation.  Ice pack overlies the hip.  No adverse features identified.  IMPRESSION: Status post hip arthroplasty.  No adverse features identified.   Original Report Authenticated By: Norva Pavlov, M.D.    Disposition:  To skilled nursing facility      Discharge Orders   Future Orders Complete By  Expires     Call MD / Call 911  As directed     Comments:      If you experience chest pain or shortness of breath, CALL 911 and be transported to the hospital emergency room.  If you develope a fever above 101 F, pus (white drainage) or increased drainage or redness at the wound, or calf pain, call your surgeon's office.    Call MD / Call 911  As directed     Comments:      If you experience chest pain or shortness of breath, CALL 911 and be transported to the hospital emergency room.  If you develope a fever above 101 F, pus (white drainage) or increased drainage or redness at the wound, or calf pain, call your surgeon's office.    Change dressing  As directed     Comments:      The dressing is a aquacell dressing that is meant to be left in place until your 2 week follow up with the physician.    Constipation Prevention  As directed     Comments:      Drink plenty of fluids.  Prune juice may be helpful.  You may use a stool softener, such as Colace (over the counter) 100 mg twice a day.  Use MiraLax (over the counter) for constipation as needed.    Constipation Prevention  As directed     Comments:      Drink plenty of fluids.  Prune juice may be helpful.  You may use a stool softener, such as Colace (over the counter) 100 mg twice a day.  Use MiraLax (over the counter) for constipation as needed.    Diet - low sodium heart healthy  As directed     Diet - low  sodium heart healthy  As directed     Discharge instructions  As directed     Comments:      Anterior Total Hip Replacement   NO HIP PRECAUTIONS Care After Refer to this sheet in the next few weeks. These discharge instructions provide you with general information on caring for yourself after you leave the hospital. Your caregiver may also give you specific instructions. Your treatment has been planned according to the most current medical practices available, but unavoidable complications sometimes occur. If you have any problems or questions after discharge, please call your caregiver. Regaining a near full range of motion of your hip within the first 3 to 6 weeks after surgery is critical. HOME CARE INSTRUCTIONS  You may resume a normal diet and activities as directed.  Perform exercises as directed.  You will receive physical therapy daily  Take showers instead of baths until informed otherwise.  You may shower daily.   Leave dressing in place until return appointment on July 7th.  Please wash whole leg including wound with soap and water  It is OK to take over-the-counter tylenol in addition to the oxycodone for pain, discomfort, or fever. Oxycodone is VERY constipating.  Please take stool softener twice a day and laxatives daily until bowels are regular Eat a well-balanced diet.  Avoid lifting or driving until you are instructed otherwise.  Make an appointment to see your caregiver for stitches (suture) or staple removal as directed.  If you have been sent home with a continuous passive motion machine (CPM machine), 0-90 degrees 6 hrs a day   2 hrs a shift SEEK MEDICAL CARE IF: You have swelling of your calf or leg.  You develop shortness of breath or chest pain.  You have redness, swelling, or increasing pain in the wound.  There is pus or any unusual drainage coming from the surgical site.  You notice a bad smell coming from the surgical site or dressing.  The surgical site breaks open  after sutures or staples have been removed.  There is persistent bleeding from the suture or staple line.  You are getting worse or are not improving.  You have any other questions or concerns.  SEEK IMMEDIATE MEDICAL CARE IF:  You have a fever.  You develop a rash.  You have difficulty breathing.  You develop any reaction or side effects to medicines given.  Your knee motion is decreasing rather than improving.  MAKE SURE YOU:  Understand these instructions.  Will watch your condition.  Will get help right away if you are not doing well or get worse.    Discharge instructions  As directed     Comments:      Full weight as tolerated left hip. No hip precautions. Leave current hip dressing in place until 08/28/12; then remove and place a new dry dressing daily. Can get current left hip dressing wet in the shower.    Follow the hip precautions as taught in Physical Therapy  As directed     Comments:      Anterior total hip replacement   NO HIP PRECAUTIONS    Full weight bearing  As directed     Increase activity slowly as tolerated  As directed     Increase activity slowly as tolerated  As directed     TED hose  As directed     Comments:      Use stockings (TED hose) for 2 weeks on both leg(s).  You may remove them at night for sleeping.       Follow-up Information   Follow up On .      Schedule an appointment as soon as possible for a visit with Kathryne Hitch, MD.   Contact information:   [address] [place] [address] [phone]        Signed: Kathryne Hitch 08/24/2012, 11:48 AM

## 2012-08-24 NOTE — Discharge Summary (Signed)
Patient ID: ADOM SCHOENECK MRN: [id number] DOB/AGE: 06/28/1940 72 y.o.  Admit date: 08/21/2012 Discharge date: 08/24/2012  Admission Diagnoses:  Principal Problem:   DJD (degenerative joint disease) of left hip Active Problems:   History of atrial fibrillation   S/P ablation of atrial fibrillation   Hypothyroidism   RBBB   Hoarseness, chronic   Hydronephrosis, right   Kidney filling defect   PVC's (premature ventricular contractions)   S/P total knee arthroplasty  Discharge Diagnoses:  Principal Problem:   DJD (degenerative joint disease) of left hip Active Problems:   History of atrial fibrillation   S/P ablation of atrial fibrillation   Hypothyroidism   RBBB   Hoarseness, chronic   Hydronephrosis, right   Kidney filling defect   PVC's (premature ventricular contractions)   S/P total knee arthroplasty   Retention of urine resolving on Urecholine and Flomax.  Dr Wilson Singer is his Urologist   Past Medical History  Diagnosis Date  . History of atrial fibrillation   . S/P ablation of atrial fibrillation APRIL 2012  . Hypothyroidism   . Nonischemic cardiomyopathy PER CATH REPORT 2005    EF 20% 2005, 45-50% by echo 03/2012  . DJD (degenerative joint disease)   . RBBB   . Hoarseness, chronic SECONARY TO LARNYX INJURY 1997    NO AIRWAY ISSUES  . Hydronephrosis, right   . Hematuria   . Hypertension   . Hyperlipidemia   . Kidney filling defect RIGHT  . PVC's (premature ventricular contractions)   . S/P total knee arthroplasty 08/23/2005    left [place]  . S/P total knee arthroplasty 08/26/2004    right [place]  . Coronary artery disease CARDIOLOGIST-  [facility]    no significant CAD by 2005 cath, normal nuclear stress 2010    Surgeries: Procedure(s): TOTAL HIP ARTHROPLASTY ANTERIOR APPROACH on 08/21/2012   Consultants:    Discharged Condition: Improved  Hospital Course: REMON QUINTO is an 72 y.o. male who was admitted 08/21/2012 for operative  treatment ofDJD (degenerative joint disease) of hip. Patient has severe unremitting pain that affects sleep, daily activities, and work/hobbies. After pre-op clearance the patient was taken to the operating room on 08/21/2012 and underwent  Procedure(s): TOTAL HIP ARTHROPLASTY ANTERIOR APPROACH.    Patient was given perioperative antibiotics: Anti-infectives   Start     Dose/Rate Route Frequency Ordered Stop   08/21/12 1215  ceFAZolin (ANCEF) IVPB 2 g/50 mL premix     2 g 100 mL/hr over 30 Minutes Intravenous Every 6 hours 08/21/12 1206 08/22/12 0014   08/21/12 0600  ceFAZolin (ANCEF) IVPB 2 g/50 mL premix     2 g 100 mL/hr over 30 Minutes Intravenous On call to O.R. 08/20/12 1435 08/21/12 0728       Patient was given sequential compression devices, early ambulation, and chemoprophylaxis to prevent DVT.  Post op day 1 and 2 patient had trouble with urinary retention.   He was finally able to void on post op day 2 after starting Flomax 0.4mg  on post op day 1 and Urecholine 25 mg q 6 hours on post op day 2.  Dr Wilson Singer is the patient's urologist.  If he has difficulty at the skilled nursing facility he should see him.  Patient was verbally abusive and vulgar to the staff on multiple occasions and threw his walker.  I spoke with him in depth regarding his inappropriate behavior.  This poor behavior continued throughout his hospital stay.  He has walked well with  PT.  He has voided and had a bowel movement.  Patient benefited maximally from hospital stay and there were no other complications.    Recent vital signs: Patient Vitals for the past 24 hrs:  BP Temp Temp src Pulse Resp SpO2  08/24/12 0620 110/61 mmHg 98.6 F (37 C) Oral 82 18 98 %  08/23/12 1300 102/54 mmHg 99.1 F (37.3 C) - 83 18 97 %  08/23/12 1100 100/72 mmHg - - 83 - -     Recent laboratory studies:  Recent Labs  08/23/12 0420 08/24/12 0435  WBC 13.9* 10.9*  HGB 11.6* 9.5*  HCT 34.1* 27.9*  PLT 134* 131*  NA 129* 131*  K  4.1 3.7  CL 95* 99  CO2 29 26  BUN 22 18  CREATININE 0.95 0.68  GLUCOSE 118* 110*  CALCIUM 9.1 8.5     Discharge Medications:     Medication List    STOP taking these medications       celecoxib 200 MG capsule  Commonly known as:  CELEBREX     HYDROcodone-acetaminophen 10-325 MG per tablet  Commonly known as:  NORCO     VIAGRA PO      TAKE these medications       aspirin 325 MG EC tablet  Take 1 tablet (325 mg total) by mouth 2 (two) times daily.     carvedilol 25 MG tablet  Commonly known as:  COREG  Take 0.5 tablets (12.5 mg total) by mouth 2 (two) times daily.     DSS 100 MG Caps  Take 100 mg by mouth 2 (two) times daily.     levothyroxine 50 MCG tablet  Commonly known as:  SYNTHROID, LEVOTHROID  Take 50 mcg by mouth daily before breakfast.     oxyCODONE 5 MG immediate release tablet  Commonly known as:  Oxy IR/ROXICODONE  1-2 tablets every 4-6 hrs as needed for pain     pantoprazole 40 MG tablet  Commonly known as:  PROTONIX  Take 1 tablet (40 mg total) by mouth daily.     ramipril 10 MG capsule  Commonly known as:  ALTACE  Take 1 capsule (10 mg total) by mouth every morning.     simvastatin 40 MG tablet  Commonly known as:  ZOCOR  Take 40 mg by mouth every morning.     tamsulosin 0.4 MG Caps  Commonly known as:  FLOMAX  Take 1 capsule (0.4 mg total) by mouth daily.        Diagnostic Studies: Dg Chest 2 View  08/07/2012   *RADIOLOGY REPORT*  Clinical Data: Preop radiograph.  DJD.  CHEST - 2 VIEW  Comparison: 01/08/11  Findings: Heart size is normal.  Lung volumes are low.  No pleural effusion or edema identified.  Multilevel spondylosis is noted within the thoracic spine. There is severe osteoarthritis involving the left glenohumeral joint.  IMPRESSION:  1.  No acute cardiopulmonary abnormalities. 2.  Thoracic spondylosis and advanced osteoarthritis involving the left shoulder.   Original Report Authenticated By: Signa Kell, M.D.   Dg Hip  Operative Left  08/21/2012   *RADIOLOGY REPORT*  Clinical Data: Total hip arthroplasty.  OPERATIVE LEFT HIP  Comparison: None.  Findings: 2 intraoperative views.  These demonstrate a left hip arthroplasty without acute complication.  Artifact degradation on the initial film.  IMPRESSION: Intraoperative imaging of left hip arthroplasty.   Original Report Authenticated By: Jeronimo Greaves, M.D.   Dg Pelvis Portable  08/21/2012   *RADIOLOGY REPORT*  Clinical Data: Left hip replacement  PORTABLE PELVIS  Comparison: None.  Findings: There is been total hip replacement on the left. Acetabular femoral components appear well positioned and there is no radiographically detectable complication.  Advanced osteoarthritis noted on the right hip as well.  IMPRESSION: Good appearance following total hip replacement on the left.   Original Report Authenticated By: Paulina Fusi, M.D.   Dg Hip Portable 1 View Left  08/21/2012   *RADIOLOGY REPORT*  Clinical Data: Postop.  PORTABLE LEFT HIP - 1 VIEW  Comparison: 08/21/2012  Findings: The single cross-table lateral view is performed and compared to AP pelvis on the same day.  Patient has undergone hip arthroplasty.  The femoral head component is well seated.  No evidence for dislocation.  Ice pack overlies the hip.  No adverse features identified.  IMPRESSION: Status post hip arthroplasty.  No adverse features identified.   Original Report Authenticated By: Norva Pavlov, M.D.    Disposition: 02- Skilled Nursing Facility Placement      Discharge Orders   Future Orders Complete By Expires     Call MD / Call 911  As directed     Comments:      If you experience chest pain or shortness of breath, CALL 911 and be transported to the hospital emergency room.  If you develope a fever above 101 F, pus (white drainage) or increased drainage or redness at the wound, or calf pain, call your surgeon's office.    Change dressing  As directed     Comments:      The dressing is a  aquacell dressing that is meant to be left in place until your 2 week follow up with the physician.    Constipation Prevention  As directed     Comments:      Drink plenty of fluids.  Prune juice may be helpful.  You may use a stool softener, such as Colace (over the counter) 100 mg twice a day.  Use MiraLax (over the counter) for constipation as needed.    Diet - low sodium heart healthy  As directed     Discharge instructions  As directed     Comments:      Anterior Total Hip Replacement   NO HIP PRECAUTIONS Care After Refer to this sheet in the next few weeks. These discharge instructions provide you with general information on caring for yourself after you leave the hospital. Your caregiver may also give you specific instructions. Your treatment has been planned according to the most current medical practices available, but unavoidable complications sometimes occur. If you have any problems or questions after discharge, please call your caregiver. Regaining a near full range of motion of your hip within the first 3 to 6 weeks after surgery is critical. HOME CARE INSTRUCTIONS  You may resume a normal diet and activities as directed.  Perform exercises as directed.  You will receive physical therapy daily  Take showers instead of baths until informed otherwise.  You may shower daily.   Leave dressing in place until return appointment on July 7th.  Please wash whole leg including wound with soap and water  It is OK to take over-the-counter tylenol in addition to the oxycodone for pain, discomfort, or fever. Oxycodone is VERY constipating.  Please take stool softener twice a day and laxatives daily until bowels are regular Eat a well-balanced diet.  Avoid lifting or driving until you are instructed otherwise.  Make an appointment to see your caregiver  for stitches (suture) or staple removal as directed.  If you have been sent home with a continuous passive motion machine (CPM machine), 0-90  degrees 6 hrs a day   2 hrs a shift SEEK MEDICAL CARE IF: You have swelling of your calf or leg.  You develop shortness of breath or chest pain.  You have redness, swelling, or increasing pain in the wound.  There is pus or any unusual drainage coming from the surgical site.  You notice a bad smell coming from the surgical site or dressing.  The surgical site breaks open after sutures or staples have been removed.  There is persistent bleeding from the suture or staple line.  You are getting worse or are not improving.  You have any other questions or concerns.  SEEK IMMEDIATE MEDICAL CARE IF:  You have a fever.  You develop a rash.  You have difficulty breathing.  You develop any reaction or side effects to medicines given.  Your knee motion is decreasing rather than improving.  MAKE SURE YOU:  Understand these instructions.  Will watch your condition.  Will get help right away if you are not doing well or get worse.    Follow the hip precautions as taught in Physical Therapy  As directed     Comments:      Anterior total hip replacement   NO HIP PRECAUTIONS    Increase activity slowly as tolerated  As directed     TED hose  As directed     Comments:      Use stockings (TED hose) for 2 weeks on both leg(s).  You may remove them at night for sleeping.       Follow-up Information   Follow up with Nilda Simmer, MD On 09/04/2012. (appt 2 pm)    Contact information:   [address] [place] [address] [phone]        Signed: Pascal Lux 08/24/2012, 7:42 AM

## 2012-08-24 NOTE — Progress Notes (Signed)
Patient refused all HS meds including urecholine. Medication explained to patient and the purpose of it but patient continues to refuse all his medication stating "he is tired". Per patient he is voiding fine and is no longer having any more problems. Will continue to monitor patient uop.

## 2012-08-24 NOTE — Progress Notes (Signed)
Patient got denied BCBS to go to SNF and patient cannot privately pay for SNF. Patient plans to go home with home health. SW provided patient with a cab voucher. Clinical Social Worker will sign off for now as social work intervention is no longer needed. Please consult [place] again if new need arises.    Sabino Niemann, MSW, [phone]

## 2012-08-24 NOTE — Care Management Note (Signed)
    Page 1 of 2   08/24/2012     4:58:55 PM   CARE MANAGEMENT NOTE 08/24/2012  Patient:  AMAR, SIPPEL   Account Number:  [id number]  Date Initiated:  08/22/2012  Documentation initiated by:  [facility]  Subjective/Objective Assessment:   admitted postop left total hip     Action/Plan:   PT/OT evals recommended SNF   Anticipated DC Date:  08/24/2012   Anticipated DC Plan:  HOME W HOME HEALTH SERVICES  In-house referral  Clinical Social Worker      DC Planning Services  CM consult      Choice offered to / List presented to:     DME arranged  3-N-1  WALKER - ROLLING      DME agency  [employer]     HH arranged  HH-1 RN  HH-2 PT  HH-3 OT      [facility] agency  [employer]   Status of service:  Completed, signed off Medicare Important Message given?   (If response is "NO", the following Medicare IM given date fields will be blank) Date Medicare IM given:   Date Additional Medicare IM given:    Discharge Disposition:  HOME W HOME HEALTH SERVICES  Per UR Regulation:  Reviewed for med. necessity/level of care/duration of stay  If discussed at Long Length of Stay Meetings, dates discussed:    Comments:  08/24/12 Patient decided to go home with [facility]. MD's office had set up HHPT, HHOT and HHRN with [facility]. Contacted Lesli Issa with [facility] and confirmed that patient is set up for [facility]. Contacted Brent with [employer]. 3N1 and rolling walker delivered to patient prior to discharge. Jacquelynn Cree RN, BSB, CCM

## 2012-08-24 NOTE — Progress Notes (Addendum)
Physical Therapy Treatment Patient Details Name: Colton Montgomery MRN: [id number] DOB: April 13, 1940 Today's Date: 08/24/2012 Time: 1300-1310 PT Time Calculation (min): 10 min  PT Assessment / Plan / Recommendation  PT Comments   Patient seen by therapy to complete stair training. Patient was denied SNF due to insurance. Will need home health PT at discharge  Follow Up Recommendations  Home health PT     Does the patient have the potential to tolerate intense rehabilitation     Barriers to Discharge        Equipment Recommendations  Rolling walker with 5" wheels    Recommendations for Other Services    Frequency 7X/week   Progress towards PT Goals    Plan Current plan remains appropriate    Precautions / Restrictions Precautions Precautions: Fall Restrictions LLE Weight Bearing: Weight bearing as tolerated   Pertinent Vitals/Pain no apparent distress     Mobility  Bed Mobility Bed Mobility: Not assessed Transfers Sit to Stand: 6: Modified independent (Device/Increase time) Stand to Sit: 6: Modified independent (Device/Increase time) Ambulation/Gait Ambulation/Gait Assistance: 6: Modified independent (Device/Increase time) Ambulation Distance (Feet): 300 Feet Assistive device: Rolling walker Gait Pattern: Step-to pattern Stairs: Yes Stairs Assistance: 5: Supervision Stair Management Technique: One rail Left;Forwards Number of Stairs: 5    Exercises     PT Diagnosis:    PT Problem List:   PT Treatment Interventions:     PT Goals (current goals can now be found in the care plan section)    Visit Information  Last PT Received On: 08/24/12 Assistance Needed: +1    Subjective Data      Cognition  Cognition Arousal/Alertness: Awake/alert Behavior During Therapy: WFL for tasks assessed/performed Overall Cognitive Status: Within Functional Limits for tasks assessed    Balance     End of Session PT - End of Session Equipment Utilized During Treatment:  Gait belt Activity Tolerance: Patient tolerated treatment well Patient left: in chair;with call bell/phone within reach Nurse Communication: Mobility status   GP     Fredrich Birks 08/24/2012, 1:12 PM 08/24/2012 Fredrich Birks PTA [phone] pager [phone] office

## 2012-12-13 DIAGNOSIS — M2040 Other hammer toe(s) (acquired), unspecified foot: Secondary | ICD-10-CM

## 2013-01-02 ENCOUNTER — Other Ambulatory Visit (HOSPITAL_COMMUNITY): Payer: Self-pay | Admitting: Urology

## 2013-01-02 DIAGNOSIS — N135 Crossing vessel and stricture of ureter without hydronephrosis: Secondary | ICD-10-CM

## 2013-01-24 ENCOUNTER — Encounter (HOSPITAL_COMMUNITY)
Admission: RE | Admit: 2013-01-24 | Discharge: 2013-01-24 | Disposition: A | Discharge status: Home / Self Care | Payer: BC Managed Care – PPO | Source: Ambulatory Visit | Attending: Urology | Admitting: Urology

## 2013-01-24 ENCOUNTER — Encounter (HOSPITAL_COMMUNITY): Payer: Self-pay

## 2013-01-24 DIAGNOSIS — N135 Crossing vessel and stricture of ureter without hydronephrosis: Secondary | ICD-10-CM

## 2013-01-24 MED ORDER — TECHNETIUM TC 99M MERTIATIDE
15.8000 | Freq: Once | INTRAVENOUS | Status: AC | PRN
  Administered 2013-01-24: 15.8 via INTRAVENOUS

## 2013-01-24 MED ORDER — FUROSEMIDE 10 MG/ML IJ SOLN
40.0000 mg | Freq: Once | INTRAMUSCULAR | Status: AC
  Administered 2013-01-24: 37 mg via INTRAVENOUS
  Filled 2013-01-24: qty 4

## 2013-03-06 ENCOUNTER — Other Ambulatory Visit: Payer: Self-pay | Admitting: Cardiovascular Disease

## 2013-03-06 DIAGNOSIS — R5383 Other fatigue: Secondary | ICD-10-CM

## 2013-03-06 DIAGNOSIS — R5381 Other malaise: Secondary | ICD-10-CM

## 2013-03-06 DIAGNOSIS — E782 Mixed hyperlipidemia: Secondary | ICD-10-CM

## 2013-03-06 DIAGNOSIS — Z79899 Other long term (current) drug therapy: Secondary | ICD-10-CM

## 2013-03-06 NOTE — Telephone Encounter (Signed)
Pt needs labs.  Will mail lab order for appt on 1.13.15 at 4 pm w/ Dr. Royann Shivers.  Call to pt and left message to call back today before 4pm.

## 2013-03-12 ENCOUNTER — Telehealth: Payer: Self-pay | Admitting: Cardiovascular Disease

## 2013-03-12 ENCOUNTER — Ambulatory Visit: Payer: BC Managed Care – PPO | Admitting: Cardiovascular Disease

## 2013-03-12 NOTE — Telephone Encounter (Signed)
Please call-he has an appt tomorrow. He received a lot of paper work in the [employer] about fasting. He does not understand all of this.

## 2013-03-12 NOTE — Telephone Encounter (Signed)
Returned call and pt verified x 2.  Pt informed message received and lab slip was mailed for labs that needed to be completed before appt.  Pt stated he had labs done two weeks ago at [facility] office.  Pt advised to bring in a copy to appt tomorrow.  Pt verbalized understanding and agreed w/ plan.

## 2013-03-13 ENCOUNTER — Encounter: Payer: Self-pay | Admitting: Cardiovascular Disease

## 2013-03-13 ENCOUNTER — Ambulatory Visit (INDEPENDENT_AMBULATORY_CARE_PROVIDER_SITE_OTHER): Payer: BC Managed Care – PPO | Admitting: Cardiovascular Disease

## 2013-03-13 VITALS — BP 130/80 | HR 88 | Resp 16 | Ht 70.0 in | Wt 166.0 lb

## 2013-03-13 DIAGNOSIS — Z9889 Other specified postprocedural states: Secondary | ICD-10-CM

## 2013-03-13 DIAGNOSIS — Z8679 Personal history of other diseases of the circulatory system: Secondary | ICD-10-CM | POA: Diagnosis not present

## 2013-03-13 DIAGNOSIS — I4949 Other premature depolarization: Secondary | ICD-10-CM

## 2013-03-13 DIAGNOSIS — I493 Ventricular premature depolarization: Secondary | ICD-10-CM

## 2013-03-13 MED ORDER — RIVAROXABAN 20 MG PO TABS: 20.0000 mg | ORAL_TABLET | Freq: Every day | ORAL | Status: DC

## 2013-03-13 NOTE — Assessment & Plan Note (Signed)
Fortunately he clearly has recurrent atrial for relation. The date of onset is uncertain and cardioversion should not be attempted without preceding anti-coagulation. He does not appear to be particularly symptomatic.  Recommended that he restart anticoagulation with Xarelto but she did quite well postoperatively. Consider cardioversion after at least 3 weeks of uninterrupted anticoagulation, but this is not urgent since he is not particularly symptomatic. He wants to have another "go" at radiofrequency ablation and requested that a referral back to Dr. Hurman Horn at [facility]. I think this is a reasonable approach, although I warned him that a repeat ablation still does not guarantee recurrence of atrial fibrillation and I suspect he'll need to be on anticoagulants forever. I think this will also be Dr. Metro Kung recommendation He is on treatment with carvedilol and ramipril for his very mild cardiomyopathy.
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
## 2013-03-13 NOTE — Patient Instructions (Signed)
Your physician recommends that you schedule a follow-up appointment in: 3 months with Dr Harrold Donath  START XARELTO 20 MG DAILY

## 2013-03-14 ENCOUNTER — Encounter: Payer: Self-pay | Admitting: Cardiovascular Disease

## 2013-03-26 ENCOUNTER — Other Ambulatory Visit: Payer: Self-pay | Admitting: Cardiovascular Disease

## 2013-03-26 NOTE — Telephone Encounter (Signed)
Rx was sent to pharmacy electronically. 

## 2013-05-04 ENCOUNTER — Encounter (HOSPITAL_COMMUNITY): Payer: Self-pay | Admitting: Emergency Medicine

## 2013-05-04 ENCOUNTER — Observation Stay (HOSPITAL_COMMUNITY)
Admission: EM | Admit: 2013-05-04 | Discharge: 2013-05-05 | Disposition: A | Discharge status: Home / Self Care | Payer: BC Managed Care – PPO | Attending: Internal Medicine | Admitting: Internal Medicine

## 2013-05-04 DIAGNOSIS — I2510 Atherosclerotic heart disease of native coronary artery without angina pectoris: Secondary | ICD-10-CM | POA: Insufficient documentation

## 2013-05-04 DIAGNOSIS — D689 Coagulation defect, unspecified: Secondary | ICD-10-CM | POA: Insufficient documentation

## 2013-05-04 DIAGNOSIS — I4891 Unspecified atrial fibrillation: Secondary | ICD-10-CM | POA: Insufficient documentation

## 2013-05-04 DIAGNOSIS — K625 Hemorrhage of anus and rectum: Principal | ICD-10-CM | POA: Diagnosis present

## 2013-05-04 DIAGNOSIS — Z79899 Other long term (current) drug therapy: Secondary | ICD-10-CM | POA: Insufficient documentation

## 2013-05-04 DIAGNOSIS — I10 Essential (primary) hypertension: Secondary | ICD-10-CM | POA: Diagnosis not present

## 2013-05-04 DIAGNOSIS — E785 Hyperlipidemia, unspecified: Secondary | ICD-10-CM | POA: Insufficient documentation

## 2013-05-04 DIAGNOSIS — R319 Hematuria, unspecified: Secondary | ICD-10-CM | POA: Insufficient documentation

## 2013-05-04 DIAGNOSIS — Z87891 Personal history of nicotine dependence: Secondary | ICD-10-CM | POA: Insufficient documentation

## 2013-05-04 DIAGNOSIS — N1330 Unspecified hydronephrosis: Secondary | ICD-10-CM | POA: Insufficient documentation

## 2013-05-04 DIAGNOSIS — M1990 Unspecified osteoarthritis, unspecified site: Secondary | ICD-10-CM | POA: Insufficient documentation

## 2013-05-04 DIAGNOSIS — Z8679 Personal history of other diseases of the circulatory system: Secondary | ICD-10-CM

## 2013-05-04 DIAGNOSIS — I428 Other cardiomyopathies: Secondary | ICD-10-CM | POA: Insufficient documentation

## 2013-05-04 DIAGNOSIS — E039 Hypothyroidism, unspecified: Secondary | ICD-10-CM | POA: Diagnosis present

## 2013-05-04 DIAGNOSIS — Z9889 Other specified postprocedural states: Secondary | ICD-10-CM

## 2013-05-04 DIAGNOSIS — R339 Retention of urine, unspecified: Secondary | ICD-10-CM

## 2013-05-04 DIAGNOSIS — R490 Dysphonia: Secondary | ICD-10-CM | POA: Diagnosis present

## 2013-05-04 DIAGNOSIS — I4510 Unspecified right bundle-branch block: Secondary | ICD-10-CM

## 2013-05-04 DIAGNOSIS — Z96659 Presence of unspecified artificial knee joint: Secondary | ICD-10-CM

## 2013-05-04 DIAGNOSIS — I493 Ventricular premature depolarization: Secondary | ICD-10-CM

## 2013-05-04 DIAGNOSIS — R93429 Abnormal radiologic findings on diagnostic imaging of unspecified kidney: Secondary | ICD-10-CM

## 2013-05-04 DIAGNOSIS — R9389 Abnormal findings on diagnostic imaging of other specified body structures: Secondary | ICD-10-CM | POA: Insufficient documentation

## 2013-05-04 DIAGNOSIS — I4949 Other premature depolarization: Secondary | ICD-10-CM | POA: Insufficient documentation

## 2013-05-04 DIAGNOSIS — I452 Bifascicular block: Secondary | ICD-10-CM | POA: Diagnosis present

## 2013-05-04 HISTORY — DX: Hemorrhage of anus and rectum: K62.5

## 2013-05-04 LAB — CBC
HCT: 41.2 % (ref 39.0–52.0)
HCT: 40.9 % (ref 39.0–52.0)
Hemoglobin: 14.1 g/dL (ref 13.0–17.0)
Hemoglobin: 14 g/dL (ref 13.0–17.0)
MCH: 31.2 pg (ref 26.0–34.0)
MCH: 31.3 pg (ref 26.0–34.0)
MCHC: 34.2 g/dL (ref 30.0–36.0)
MCHC: 34.2 g/dL (ref 30.0–36.0)
MCV: 91.2 fL (ref 78.0–100.0)
MCV: 91.3 fL (ref 78.0–100.0)
Platelets: 206 10*3/uL (ref 150–400)
Platelets: 195 10*3/uL (ref 150–400)
RBC: 4.52 MIL/uL (ref 4.22–5.81)
RBC: 4.48 MIL/uL (ref 4.22–5.81)
RDW: 13.5 % (ref 11.5–15.5)
RDW: 13.4 % (ref 11.5–15.5)
WBC: 8 10*3/uL (ref 4.0–10.5)
WBC: 7.6 10*3/uL (ref 4.0–10.5)

## 2013-05-04 LAB — COMPREHENSIVE METABOLIC PANEL
ALT: 13 U/L (ref 0–53)
AST: 23 U/L (ref 0–37)
Albumin: 3.5 g/dL (ref 3.5–5.2)
Alkaline Phosphatase: 76 U/L (ref 39–117)
BUN: 15 mg/dL (ref 6–23)
CO2: 26 mEq/L (ref 19–32)
Calcium: 9.5 mg/dL (ref 8.4–10.5)
Chloride: 101 mEq/L (ref 96–112)
Creatinine, Ser: 0.76 mg/dL (ref 0.50–1.35)
GFR calc Af Amer: >90 mL/min (ref >90)
GFR calc non Af Amer: 90 mL/min — ABNORMAL LOW (ref >90)
Glucose, Bld: 123 mg/dL — ABNORMAL HIGH (ref 70–99)
Potassium: 4.9 mEq/L (ref 3.7–5.3)
Sodium: 138 mEq/L (ref 137–147)
Total Bilirubin: 0.4 mg/dL (ref 0.3–1.2)
Total Protein: 7.4 g/dL (ref 6.0–8.3)

## 2013-05-04 LAB — CBC WITH DIFFERENTIAL/PLATELET
Basophils Absolute: 0 10*3/uL (ref 0.0–0.1)
Basophils Relative: 0 % (ref 0–1)
Eosinophils Absolute: 0.2 10*3/uL (ref 0.0–0.7)
Eosinophils Relative: 2 % (ref 0–5)
HCT: 44.9 % (ref 39.0–52.0)
Hemoglobin: 15.3 g/dL (ref 13.0–17.0)
Lymphocytes Relative: 26 % (ref 12–46)
Lymphs Abs: 1.9 10*3/uL (ref 0.7–4.0)
MCH: 31.3 pg (ref 26.0–34.0)
MCHC: 34.1 g/dL (ref 30.0–36.0)
MCV: 91.8 fL (ref 78.0–100.0)
Monocytes Absolute: 0.7 10*3/uL (ref 0.1–1.0)
Monocytes Relative: 10 % (ref 3–12)
Neutro Abs: 4.5 10*3/uL (ref 1.7–7.7)
Neutrophils Relative %: 61 % (ref 43–77)
Platelets: 198 10*3/uL (ref 150–400)
RBC: 4.89 MIL/uL (ref 4.22–5.81)
RDW: 13.5 % (ref 11.5–15.5)
WBC: 7.3 10*3/uL (ref 4.0–10.5)

## 2013-05-04 LAB — TYPE AND SCREEN
ABO/RH(D): O POS
Antibody Screen: NEGATIVE

## 2013-05-04 LAB — APTT: aPTT: 49 seconds — ABNORMAL HIGH (ref 24–37)

## 2013-05-04 LAB — SAMPLE TO BLOOD BANK

## 2013-05-04 LAB — POC OCCULT BLOOD, ED: Fecal Occult Bld: POSITIVE — AB

## 2013-05-04 LAB — PROTIME-INR
INR: 1.48 (ref 0.00–1.49)
Prothrombin Time: 17.5 seconds — ABNORMAL HIGH (ref 11.6–15.2)

## 2013-05-04 MED ORDER — ACETAMINOPHEN 650 MG RE SUPP: 650.0000 mg | Freq: Four times a day (QID) | RECTAL | Status: DC | PRN

## 2013-05-04 MED ORDER — ONDANSETRON HCL 4 MG/2ML IJ SOLN: 4.0000 mg | Freq: Four times a day (QID) | INTRAMUSCULAR | Status: DC | PRN

## 2013-05-04 MED ORDER — SODIUM CHLORIDE 0.9 % IV SOLN
INTRAVENOUS | Status: DC
  Administered 2013-05-04: 09:00:00 via INTRAVENOUS

## 2013-05-04 MED ORDER — SIMVASTATIN 40 MG PO TABS
40.0000 mg | ORAL_TABLET | Freq: Every day | ORAL | Status: DC
  Filled 2013-05-04: qty 1

## 2013-05-04 MED ORDER — ACETAMINOPHEN 325 MG PO TABS: 650.0000 mg | ORAL_TABLET | Freq: Four times a day (QID) | ORAL | Status: DC | PRN

## 2013-05-04 MED ORDER — LEVOTHYROXINE SODIUM 50 MCG PO TABS
50.0000 ug | ORAL_TABLET | Freq: Every day | ORAL | Status: DC
  Administered 2013-05-05: 50 ug via ORAL
  Filled 2013-05-04 (×2): qty 1

## 2013-05-04 MED ORDER — ALBUTEROL SULFATE (2.5 MG/3ML) 0.083% IN NEBU: 2.5000 mg | INHALATION_SOLUTION | RESPIRATORY_TRACT | Status: DC | PRN

## 2013-05-04 MED ORDER — CARVEDILOL 12.5 MG PO TABS
12.5000 mg | ORAL_TABLET | Freq: Two times a day (BID) | ORAL | Status: DC
  Administered 2013-05-04 – 2013-05-05 (×2): 12.5 mg via ORAL
  Filled 2013-05-04 (×4): qty 1

## 2013-05-04 MED ORDER — SODIUM CHLORIDE 0.9 % IV SOLN
INTRAVENOUS | Status: AC
  Administered 2013-05-04: 50 mL/h via INTRAVENOUS

## 2013-05-04 MED ORDER — ONDANSETRON HCL 4 MG PO TABS: 4.0000 mg | ORAL_TABLET | Freq: Four times a day (QID) | ORAL | Status: DC | PRN

## 2013-05-04 MED ORDER — RAMIPRIL 10 MG PO CAPS
10.0000 mg | ORAL_CAPSULE | Freq: Every day | ORAL | Status: DC
  Filled 2013-05-04: qty 1
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
## 2013-05-04 NOTE — ED Notes (Addendum)
Pt from home with c/o of rectal bleeding starting this am.  Pt reports "bloody diarrhea" consistency.  Pt denies N/V, dizziness, or abdominal pain.  Pt started on xeralto x 2 weeks ago.  Pt in NAD, ambulatory, A&O.

## 2013-05-04 NOTE — ED Provider Notes (Signed)
EKG Interpretation  Date/Time:  Friday May 04 2013 11:48:48 EST Ventricular Rate:  74 PR Interval:    QRS Duration: 156 QT Interval:  473 QTC Calculation: 525 R Axis:   -62 Text Interpretation:  Since last tracing 24 Aug 2005 Atrial flutter with predominant 4:1 AV block RBBB and LAFB Confirmed by Jawuan Robb  MD-I, Mcarthur Ivins ([id number]) on 05/04/2013 12:34:11 PM         Ward Givens, MD 05/04/13 1234
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
## 2013-05-05 DIAGNOSIS — K625 Hemorrhage of anus and rectum: Secondary | ICD-10-CM | POA: Diagnosis not present

## 2013-05-05 DIAGNOSIS — D689 Coagulation defect, unspecified: Secondary | ICD-10-CM | POA: Diagnosis not present

## 2013-05-05 LAB — BASIC METABOLIC PANEL
BUN: 10 mg/dL (ref 6–23)
CO2: 25 mEq/L (ref 19–32)
Calcium: 8.9 mg/dL (ref 8.4–10.5)
Chloride: 102 mEq/L (ref 96–112)
Creatinine, Ser: 0.77 mg/dL (ref 0.50–1.35)
GFR calc Af Amer: >90 mL/min (ref >90)
GFR calc non Af Amer: 89 mL/min — ABNORMAL LOW (ref >90)
Glucose, Bld: 89 mg/dL (ref 70–99)
Potassium: 4.2 mEq/L (ref 3.7–5.3)
Sodium: 137 mEq/L (ref 137–147)

## 2013-05-05 LAB — CBC
HCT: 41.6 % (ref 39.0–52.0)
Hemoglobin: 14.2 g/dL (ref 13.0–17.0)
MCH: 31.3 pg (ref 26.0–34.0)
MCHC: 34.1 g/dL (ref 30.0–36.0)
MCV: 91.6 fL (ref 78.0–100.0)
Platelets: 191 10*3/uL (ref 150–400)
RBC: 4.54 MIL/uL (ref 4.22–5.81)
RDW: 13.7 % (ref 11.5–15.5)
WBC: 7.8 10*3/uL (ref 4.0–10.5)

## 2013-05-05 NOTE — Discharge Summary (Signed)
Physician Discharge Summary  Patient ID: Colton Montgomery MRN: [id number] DOB/AGE: March 20, 1940 73 y.o.  Admit date: 05/04/2013 Discharge date: 05/05/2013  Primary Care Physician:  Lupita RaiderSHAW,KIMBERLEE, MD  Discharge Diagnoses:    . Bright red rectal bleeding/lower GI bleed likely diverticular  . Hypothyroidism . RBBB . Hoarseness, chronic . history of atrial fibrillation on xarelto  Consults: Gastroenterology, Dr. Bosie ClosSchooler                  Cardiology, Dr Royann Shiversroitoru via phone consultation     Recommendations for Outpatient Follow-up:  Patient is recommended to have outpatient colonoscopy asap. He will discuss it with his gastroenterologist, Dr. Laural BenesJohnson on his appointment on 3/16  The patient was recommended to hold off on xarelto onto the coloscopy per his cardiologist's (Dr Royann Shiversroitoru) recommendations   Allergies:   Allergies  Allergen Reactions  . Tetracyclines & Related Rash     Discharge Medications:   Medication List    STOP taking these medications       XARELTO 20 MG Tabs tablet  Generic drug:  Rivaroxaban      TAKE these medications       carvedilol 25 MG tablet  Commonly known as:  COREG  Take 12.5 mg by mouth 2 (two) times daily with a meal.     levothyroxine 50 MCG tablet  Commonly known as:  SYNTHROID, LEVOTHROID  Take 50 mcg by mouth daily before breakfast.     ramipril 10 MG capsule  Commonly known as:  ALTACE  Take 10 mg by mouth daily.     simvastatin 40 MG tablet  Commonly known as:  ZOCOR  Take 40 mg by mouth daily.         Brief H and P: For complete details please refer to admission H and P, but in brief Colton Siasdwin P Vowell is a 73 y.o. male with history of atrial fibrillation, failed ablation, recently started on Xarelto in mid-January, hypothyroidism, RBBB, nonischemic cardiomyopathy, EF 45 - 50% by echo 03/2012, chronic hoarseness, HTN, HLD, CAD presented to the ED with complaints of rectal bleeding. He was in his usual state of health until 4 AM  when he had his first episode of bright red rectal bleeding without associated abdominal or rectal pain. The bowl water turned red but there were no associated clots and still had normal stools. Since then he had 3 more BMs of which 2 had seen blood in stools. He denied nausea, vomiting, dizziness, lightheadedness, palpitations or chest pain. Appetite is good and he has not lost any weight. Last colonoscopy approximately 7 years ago:? Diverticulosis. He states that he has been set up for a colonoscopy on 3/16 by Dr. Ezzard StandingNewman. Denies prior history of rectal bleeding. In the ED, stable vital signs and hemoglobin 15.3. He also states that he had routine lab work done through his PCP at the end of December and was not told to be anemic. Hospitalist admission requested.  Hospital Course:  Rectal bleeding: Most likely diverticular. Patient was admitted for observation. Xarelto was paced on hold. Patient was seen by gastroenterology, Dr. Bosie ClosSchooler who also felt that lower GI bleed is likely diverticular in origin. Patient did not have any further bleeding during the hospitalization. Dr. Bosie ClosSchooler recommended inpatient versus outpatient coloscopy and patient preferred outpatient colonoscopy with his gastroenterologist on 3/16. I also discussed with Dr.Croitoru prior to discharge who recommended to hold off on xarelto onto the coloscopy. Patient is to followup with Dr Royann Shiversroitoru after the coloscopy to resume  xarelto. He is currently in normal sinus rhythm on telemetry.   Day of Discharge BP 128/94  Pulse 83  Temp(Src) 97.9 F (36.6 C) (Oral)  Resp 18  Ht 5\' 10"  (1.778 m)  Wt 73.664 kg (162 lb 6.4 oz)  BMI 23.30 kg/m2  SpO2 93%  Physical Exam: General: Alert and awake oriented x3 not in any acute distress. CVS: S1-S2 clear no murmur rubs or gallops Chest: clear to auscultation bilaterally, no wheezing rales or rhonchi Abdomen: soft nontender, nondistended, normal bowel sounds Extremities: no cyanosis, clubbing  or edema noted bilaterally Neuro: Cranial nerves II-XII intact, no focal neurological deficits   The results of significant diagnostics from this hospitalization (including imaging, microbiology, ancillary and laboratory) are listed below for reference.    LAB RESULTS: Basic Metabolic Panel:  Recent Labs Lab 05/04/13 0805 05/05/13 0533  NA 138 137  K 4.9 4.2  CL 101 102  CO2 26 25  GLUCOSE 123* 89  BUN 15 10  CREATININE 0.76 0.77  CALCIUM 9.5 8.9   Liver Function Tests:  Recent Labs Lab 05/04/13 0805  AST 23  ALT 13  ALKPHOS 76  BILITOT 0.4  PROT 7.4  ALBUMIN 3.5   No results found for this basename: LIPASE, AMYLASE,  in the last 168 hours No results found for this basename: AMMONIA,  in the last 168 hours CBC:  Recent Labs Lab 05/04/13 0805  05/04/13 2038 05/05/13 0533  WBC 7.3  < > 7.6 7.8  NEUTROABS 4.5  --   --   --   HGB 15.3  < > 14.0 14.2  HCT 44.9  < > 40.9 41.6  MCV 91.8  < > 91.3 91.6  PLT 198  < > 195 191  < > = values in this interval not displayed. Cardiac Enzymes: No results found for this basename: CKTOTAL, CKMB, CKMBINDEX, TROPONINI,  in the last 168 hours BNP: No components found with this basename: POCBNP,  CBG: No results found for this basename: GLUCAP,  in the last 168 hours  Significant Diagnostic Studies:  No results found.     Disposition and Follow-up:     Discharge Orders   Future Orders Complete By Expires   Diet - low sodium heart healthy  As directed    Increase activity slowly  As directed        DISPOSITION:Home  DIET: Heart healthy diet  DISCHARGE FOLLOW-UP Follow-up Information   Follow up with Charolett Bumpers, MD On 05/14/2013. (please schedule Colonoscopy asap)    Specialty:  Gastroenterology   Contact information:   [address], [address] [place] [address] [phone]       Schedule an appointment as soon as possible for a visit with [name], MD. (for hospital follow-up after  colonoscopy)    Specialty:  Cardiology   Contact information:   [address] [place] [address] [phone]       Follow up with [name], MD. Schedule an appointment as soon as possible for a visit in 2 weeks. (for hospital follow-up, As needed)    Specialty:  Family Medicine   Contact information:   [address] [place] [address] [phone]       Time spent on Discharge: 35 minutes  Signed:   RAI,RIPUDEEP M.D. [facility] 05/05/2013, 8:43 AM Pager: [phone]

## 2013-05-05 NOTE — Progress Notes (Signed)
Utilization Review completed.  

## 2013-07-26 ENCOUNTER — Other Ambulatory Visit (HOSPITAL_COMMUNITY): Payer: Self-pay | Admitting: Orthopaedic Surgery

## 2013-08-01 ENCOUNTER — Encounter (HOSPITAL_COMMUNITY): Payer: Self-pay

## 2013-08-01 ENCOUNTER — Other Ambulatory Visit (HOSPITAL_COMMUNITY): Payer: Self-pay | Admitting: *Deleted

## 2013-08-01 ENCOUNTER — Encounter (HOSPITAL_COMMUNITY)
Admission: RE | Admit: 2013-08-01 | Discharge: 2013-08-01 | Disposition: A | Discharge status: Home / Self Care | Payer: BC Managed Care – PPO | Source: Ambulatory Visit | Attending: Orthopaedic Surgery | Admitting: Orthopaedic Surgery

## 2013-08-01 DIAGNOSIS — Z01812 Encounter for preprocedural laboratory examination: Secondary | ICD-10-CM | POA: Insufficient documentation

## 2013-08-01 DIAGNOSIS — Z01810 Encounter for preprocedural cardiovascular examination: Secondary | ICD-10-CM | POA: Insufficient documentation

## 2013-08-01 LAB — BASIC METABOLIC PANEL
BUN: 21 mg/dL (ref 6–23)
CO2: 30 mEq/L (ref 19–32)
Calcium: 9.6 mg/dL (ref 8.4–10.5)
Chloride: 103 mEq/L (ref 96–112)
Creatinine, Ser: 0.86 mg/dL (ref 0.50–1.35)
GFR calc Af Amer: >90 mL/min (ref >90)
GFR calc non Af Amer: 85 mL/min — ABNORMAL LOW (ref >90)
Glucose, Bld: 94 mg/dL (ref 70–99)
Potassium: 4.2 mEq/L (ref 3.7–5.3)
Sodium: 141 mEq/L (ref 137–147)

## 2013-08-01 LAB — PROTIME-INR
INR: 1.25 (ref 0.00–1.49)
Prothrombin Time: 15.4 seconds — ABNORMAL HIGH (ref 11.6–15.2)

## 2013-08-01 LAB — URINALYSIS, ROUTINE W REFLEX MICROSCOPIC
Bilirubin Urine: NEGATIVE
Glucose, UA: NEGATIVE mg/dL
Hgb urine dipstick: NEGATIVE
Ketones, ur: NEGATIVE mg/dL
Leukocytes, UA: NEGATIVE
Nitrite: NEGATIVE
Protein, ur: NEGATIVE mg/dL
Specific Gravity, Urine: 1.026 (ref 1.005–1.030)
Urobilinogen, UA: 1 mg/dL (ref 0.0–1.0)
pH: 6 (ref 5.0–8.0)

## 2013-08-01 LAB — CBC
HCT: 41.1 % (ref 39.0–52.0)
Hemoglobin: 13.7 g/dL (ref 13.0–17.0)
MCH: 31 pg (ref 26.0–34.0)
MCHC: 33.3 g/dL (ref 30.0–36.0)
MCV: 93 fL (ref 78.0–100.0)
Platelets: 216 10*3/uL (ref 150–400)
RBC: 4.42 MIL/uL (ref 4.22–5.81)
RDW: 13.2 % (ref 11.5–15.5)
WBC: 8.6 10*3/uL (ref 4.0–10.5)

## 2013-08-01 LAB — SURGICAL PCR SCREEN
MRSA, PCR: NEGATIVE
Staphylococcus aureus: NEGATIVE

## 2013-08-01 LAB — APTT: aPTT: 33 seconds (ref 24–37)

## 2013-08-01 LAB — TYPE AND SCREEN
ABO/RH(D): O POS
Antibody Screen: NEGATIVE

## 2013-08-01 NOTE — Progress Notes (Signed)
Pt also sees Dr. Alberteen Sam at [facility] for cardiology. Pt states he does not know when he goes into A-fib or A flutter. Stopped Xarelto in March when he had bleeding from the rectum, states he's been in a regular rhythm since as far as he knows.

## 2013-08-01 NOTE — Pre-Procedure Instructions (Signed)
Colton Montgomery  08/01/2013   Your procedure is scheduled on:  Tuesday, August 07, 2013 at 12:30 PM.   Report to [facility] Entrance "A" Admitting Office at 10:30 AM.   Call this number if you have problems the morning of surgery: [phone]   Remember:   Do not eat food or drink liquids after midnight Monday, 08/06/13   Take these medicines the morning of surgery with A SIP OF WATER: carvedilol (COREG), levothyroxine (SYNTHROID, LEVOTHROID)     Do not wear jewelry.  Do not wear lotions, powders, or cologne. You may wear deodorant.  Men may shave face and neck.  Do not bring valuables to the hospital.  [facility] is not responsible                  for any belongings or valuables.               Contacts, dentures or bridgework may not be worn into surgery.  Leave suitcase in the car. After surgery it may be brought to your room.  For patients admitted to the hospital, discharge time is determined by your                treatment team.               Special Instructions: [place] - Preparing for Surgery  Before surgery, you can play an important role.  Because skin is not sterile, your skin needs to be as free of germs as possible.  You can reduce the number of germs on you skin by washing with CHG (chlorahexidine gluconate) soap before surgery.  CHG is an antiseptic cleaner which kills germs and bonds with the skin to continue killing germs even after washing.  Please DO NOT use if you have an allergy to CHG or antibacterial soaps.  If your skin becomes reddened/irritated stop using the CHG and inform your nurse when you arrive at Short Stay.  Do not shave (including legs and underarms) for at least 48 hours prior to the first CHG shower.  You may shave your face.  Please follow these instructions carefully:   1.  Shower with CHG Soap the night before surgery and the                                morning of Surgery.  2.  If you choose to wash your hair, wash your hair  first as usual with your       normal shampoo.  3.  After you shampoo, rinse your hair and body thoroughly to remove the                      Shampoo.  4.  Use CHG as you would any other liquid soap.  You can apply chg directly       to the skin and wash gently with scrungie or a clean washcloth.  5.  Apply the CHG Soap to your body ONLY FROM THE NECK DOWN.        Do not use on open wounds or open sores.  Avoid contact with your eyes, ears, mouth and genitals (private parts).  Wash genitals (private parts) with your normal soap.  6.  Wash thoroughly, paying special attention to the area where your surgery        will be performed.  7.  Thoroughly rinse your  body with warm water from the neck down.  8.  DO NOT shower/wash with your normal soap after using and rinsing off       the CHG Soap.  9.  Pat yourself dry with a clean towel.            10.  Wear clean pajamas.            11.  Place clean sheets on your bed the night of your first shower and do not        sleep with pets.  Day of Surgery  Do not apply any lotions the morning of surgery.  Please wear clean clothes to the hospital/surgery center.     Please read over the following fact sheets that you were given: Pain Booklet, Coughing and Deep Breathing, Blood Transfusion Information, MRSA Information and Surgical Site Infection Prevention

## 2013-08-02 ENCOUNTER — Other Ambulatory Visit (HOSPITAL_COMMUNITY): Payer: BC Managed Care – PPO
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
## 2013-08-06 MED ORDER — CEFAZOLIN SODIUM-DEXTROSE 2-3 GM-% IV SOLR
2.0000 g | INTRAVENOUS | Status: AC
  Administered 2013-08-07: 2 g via INTRAVENOUS
  Filled 2013-08-06: qty 50

## 2013-08-07 ENCOUNTER — Inpatient Hospital Stay (HOSPITAL_COMMUNITY): Payer: BC Managed Care – PPO | Admitting: Anesthesiology

## 2013-08-07 ENCOUNTER — Encounter (HOSPITAL_COMMUNITY): Payer: BC Managed Care – PPO | Admitting: Vascular Surgery

## 2013-08-07 ENCOUNTER — Inpatient Hospital Stay (HOSPITAL_COMMUNITY): Payer: BC Managed Care – PPO

## 2013-08-07 ENCOUNTER — Encounter (HOSPITAL_COMMUNITY): Payer: Self-pay | Admitting: *Deleted

## 2013-08-07 ENCOUNTER — Inpatient Hospital Stay (HOSPITAL_COMMUNITY)
Admission: RE | Admit: 2013-08-07 | Discharge: 2013-08-10 | DRG: 470 | Disposition: A | Discharge status: Home Health | Payer: BC Managed Care – PPO | Source: Ambulatory Visit | Attending: Orthopaedic Surgery | Admitting: Orthopaedic Surgery

## 2013-08-07 ENCOUNTER — Encounter (HOSPITAL_COMMUNITY)
Admission: RE | Disposition: A | Discharge status: Home Health | Payer: Self-pay | Source: Ambulatory Visit | Attending: Orthopaedic Surgery

## 2013-08-07 DIAGNOSIS — Z881 Allergy status to other antibiotic agents status: Secondary | ICD-10-CM | POA: Diagnosis not present

## 2013-08-07 DIAGNOSIS — Z79899 Other long term (current) drug therapy: Secondary | ICD-10-CM | POA: Diagnosis not present

## 2013-08-07 DIAGNOSIS — I10 Essential (primary) hypertension: Secondary | ICD-10-CM | POA: Diagnosis present

## 2013-08-07 DIAGNOSIS — E785 Hyperlipidemia, unspecified: Secondary | ICD-10-CM | POA: Diagnosis present

## 2013-08-07 DIAGNOSIS — Z96659 Presence of unspecified artificial knee joint: Secondary | ICD-10-CM

## 2013-08-07 DIAGNOSIS — M1611 Unilateral primary osteoarthritis, right hip: Secondary | ICD-10-CM

## 2013-08-07 DIAGNOSIS — Z7982 Long term (current) use of aspirin: Secondary | ICD-10-CM

## 2013-08-07 DIAGNOSIS — Z87891 Personal history of nicotine dependence: Secondary | ICD-10-CM

## 2013-08-07 DIAGNOSIS — Z8249 Family history of ischemic heart disease and other diseases of the circulatory system: Secondary | ICD-10-CM | POA: Diagnosis not present

## 2013-08-07 DIAGNOSIS — I959 Hypotension, unspecified: Secondary | ICD-10-CM | POA: Diagnosis not present

## 2013-08-07 DIAGNOSIS — I498 Other specified cardiac arrhythmias: Secondary | ICD-10-CM | POA: Diagnosis not present

## 2013-08-07 DIAGNOSIS — E039 Hypothyroidism, unspecified: Secondary | ICD-10-CM | POA: Diagnosis present

## 2013-08-07 DIAGNOSIS — M169 Osteoarthritis of hip, unspecified: Secondary | ICD-10-CM | POA: Diagnosis present

## 2013-08-07 DIAGNOSIS — I2510 Atherosclerotic heart disease of native coronary artery without angina pectoris: Secondary | ICD-10-CM | POA: Diagnosis present

## 2013-08-07 DIAGNOSIS — M25559 Pain in unspecified hip: Secondary | ICD-10-CM | POA: Diagnosis not present

## 2013-08-07 DIAGNOSIS — M259 Joint disorder, unspecified: Secondary | ICD-10-CM | POA: Diagnosis not present

## 2013-08-07 DIAGNOSIS — Z96649 Presence of unspecified artificial hip joint: Secondary | ICD-10-CM | POA: Diagnosis not present

## 2013-08-07 DIAGNOSIS — R490 Dysphonia: Secondary | ICD-10-CM | POA: Diagnosis present

## 2013-08-07 DIAGNOSIS — I428 Other cardiomyopathies: Secondary | ICD-10-CM | POA: Diagnosis present

## 2013-08-07 DIAGNOSIS — M1610 Unilateral primary osteoarthritis, unspecified hip: Principal | ICD-10-CM | POA: Diagnosis present

## 2013-08-07 DIAGNOSIS — Z820 Family history of epilepsy and other diseases of the nervous system: Secondary | ICD-10-CM

## 2013-08-07 HISTORY — DX: Unilateral primary osteoarthritis, right hip: M16.11

## 2013-08-07 HISTORY — PX: TOTAL HIP ARTHROPLASTY: SHX124

## 2013-08-07 SURGERY — ARTHROPLASTY, HIP, TOTAL, ANTERIOR APPROACH
Anesthesia: General | Site: Hip | Laterality: Right

## 2013-08-07 MED ORDER — GLYCOPYRROLATE 0.2 MG/ML IJ SOLN
INTRAMUSCULAR | Status: DC | PRN
  Administered 2013-08-07: 0.6 mg via INTRAVENOUS

## 2013-08-07 MED ORDER — LIDOCAINE HCL (CARDIAC) 20 MG/ML IV SOLN
INTRAVENOUS | Status: AC
  Filled 2013-08-07: qty 5

## 2013-08-07 MED ORDER — ACETAMINOPHEN 325 MG PO TABS: 650.0000 mg | ORAL_TABLET | Freq: Four times a day (QID) | ORAL | Status: DC | PRN

## 2013-08-07 MED ORDER — LEVOTHYROXINE SODIUM 50 MCG PO TABS
50.0000 ug | ORAL_TABLET | Freq: Every day | ORAL | Status: DC
  Administered 2013-08-08 – 2013-08-10 (×3): 50 ug via ORAL
  Filled 2013-08-07 (×4): qty 1

## 2013-08-07 MED ORDER — SODIUM CHLORIDE 0.9 % IV SOLN
INTRAVENOUS | Status: DC
  Administered 2013-08-08: 04:00:00 via INTRAVENOUS

## 2013-08-07 MED ORDER — CARVEDILOL 12.5 MG PO TABS
12.5000 mg | ORAL_TABLET | Freq: Two times a day (BID) | ORAL | Status: DC
  Administered 2013-08-08 – 2013-08-09 (×3): 12.5 mg via ORAL
  Filled 2013-08-07 (×8): qty 1

## 2013-08-07 MED ORDER — SODIUM CHLORIDE 0.9 % IR SOLN
Status: DC | PRN
  Administered 2013-08-07: 1000 mL

## 2013-08-07 MED ORDER — ONDANSETRON HCL 4 MG PO TABS: 4.0000 mg | ORAL_TABLET | Freq: Four times a day (QID) | ORAL | Status: DC | PRN

## 2013-08-07 MED ORDER — MIDAZOLAM HCL 5 MG/5ML IJ SOLN
INTRAMUSCULAR | Status: DC | PRN
  Administered 2013-08-07: 2 mg via INTRAVENOUS

## 2013-08-07 MED ORDER — ROCURONIUM BROMIDE 50 MG/5ML IV SOLN
INTRAVENOUS | Status: AC
Start: 2013-08-07 — End: 2013-08-07
  Filled 2013-08-07: qty 1

## 2013-08-07 MED ORDER — ONDANSETRON HCL 4 MG/2ML IJ SOLN: 4.0000 mg | Freq: Four times a day (QID) | INTRAMUSCULAR | Status: DC | PRN

## 2013-08-07 MED ORDER — METHOCARBAMOL 1000 MG/10ML IJ SOLN
500.0000 mg | Freq: Four times a day (QID) | INTRAVENOUS | Status: DC | PRN
  Filled 2013-08-07: qty 5

## 2013-08-07 MED ORDER — SODIUM CHLORIDE 0.9 % IV SOLN
10.0000 mg | INTRAVENOUS | Status: DC | PRN
  Administered 2013-08-07: 10 ug/min via INTRAVENOUS

## 2013-08-07 MED ORDER — ACETAMINOPHEN 650 MG RE SUPP: 650.0000 mg | Freq: Four times a day (QID) | RECTAL | Status: DC | PRN

## 2013-08-07 MED ORDER — ROCURONIUM BROMIDE 50 MG/5ML IV SOLN
INTRAVENOUS | Status: AC
  Filled 2013-08-07: qty 1

## 2013-08-07 MED ORDER — METHOCARBAMOL 500 MG PO TABS
500.0000 mg | ORAL_TABLET | Freq: Four times a day (QID) | ORAL | Status: DC | PRN
  Administered 2013-08-07 – 2013-08-08 (×3): 500 mg via ORAL
  Filled 2013-08-07 (×3): qty 1

## 2013-08-07 MED ORDER — MIDAZOLAM HCL 2 MG/2ML IJ SOLN
INTRAMUSCULAR | Status: AC
  Filled 2013-08-07: qty 2

## 2013-08-07 MED ORDER — EPHEDRINE SULFATE 50 MG/ML IJ SOLN
INTRAMUSCULAR | Status: DC | PRN
  Administered 2013-08-07: 10 mg via INTRAVENOUS

## 2013-08-07 MED ORDER — LACTATED RINGERS IV SOLN
INTRAVENOUS | Status: DC
  Administered 2013-08-07 (×2): via INTRAVENOUS

## 2013-08-07 MED ORDER — ROCURONIUM BROMIDE 100 MG/10ML IV SOLN
INTRAVENOUS | Status: DC | PRN
  Administered 2013-08-07: 50 mg via INTRAVENOUS
  Administered 2013-08-07: 15 mg via INTRAVENOUS
  Administered 2013-08-07: 10 mg via INTRAVENOUS

## 2013-08-07 MED ORDER — SIMVASTATIN 40 MG PO TABS
40.0000 mg | ORAL_TABLET | Freq: Every day | ORAL | Status: DC
  Administered 2013-08-08 – 2013-08-10 (×3): 40 mg via ORAL
  Filled 2013-08-07 (×4): qty 1

## 2013-08-07 MED ORDER — HYDROMORPHONE HCL PF 1 MG/ML IJ SOLN
0.2500 mg | INTRAMUSCULAR | Status: DC | PRN
  Administered 2013-08-07 (×4): 0.5 mg via INTRAVENOUS

## 2013-08-07 MED ORDER — PROPOFOL 10 MG/ML IV BOLUS
INTRAVENOUS | Status: AC
  Filled 2013-08-07: qty 20

## 2013-08-07 MED ORDER — OXYCODONE HCL 5 MG/5ML PO SOLN: 5.0000 mg | Freq: Once | ORAL | Status: AC | PRN

## 2013-08-07 MED ORDER — TRANEXAMIC ACID 100 MG/ML IV SOLN
1000.0000 mg | INTRAVENOUS | Status: AC
  Administered 2013-08-07: 1000 mg via INTRAVENOUS
  Filled 2013-08-07: qty 10

## 2013-08-07 MED ORDER — NEOSTIGMINE METHYLSULFATE 10 MG/10ML IV SOLN
INTRAVENOUS | Status: DC | PRN
  Administered 2013-08-07: 4 mg via INTRAVENOUS

## 2013-08-07 MED ORDER — HYDROMORPHONE HCL PF 1 MG/ML IJ SOLN
1.0000 mg | INTRAMUSCULAR | Status: DC | PRN
  Administered 2013-08-07 – 2013-08-08 (×3): 1 mg via INTRAVENOUS
  Filled 2013-08-07 (×3): qty 1

## 2013-08-07 MED ORDER — CEFAZOLIN SODIUM 1-5 GM-% IV SOLN
1.0000 g | Freq: Four times a day (QID) | INTRAVENOUS | Status: AC
  Administered 2013-08-07 (×2): 1 g via INTRAVENOUS
  Filled 2013-08-07 (×2): qty 50

## 2013-08-07 MED ORDER — DIPHENHYDRAMINE HCL 12.5 MG/5ML PO ELIX: 12.5000 mg | ORAL_SOLUTION | ORAL | Status: DC | PRN

## 2013-08-07 MED ORDER — MENTHOL 3 MG MT LOZG: 1.0000 | LOZENGE | OROMUCOSAL | Status: DC | PRN

## 2013-08-07 MED ORDER — OXYCODONE HCL 5 MG PO TABS
5.0000 mg | ORAL_TABLET | Freq: Once | ORAL | Status: AC | PRN
  Administered 2013-08-07: 5 mg via ORAL

## 2013-08-07 MED ORDER — HYDROMORPHONE HCL PF 1 MG/ML IJ SOLN
INTRAMUSCULAR | Status: AC
  Filled 2013-08-07: qty 1

## 2013-08-07 MED ORDER — MEPERIDINE HCL 25 MG/ML IJ SOLN: 6.2500 mg | INTRAMUSCULAR | Status: DC | PRN

## 2013-08-07 MED ORDER — LIDOCAINE HCL (CARDIAC) 20 MG/ML IV SOLN
INTRAVENOUS | Status: DC | PRN
  Administered 2013-08-07: 100 mg via INTRAVENOUS

## 2013-08-07 MED ORDER — PROPOFOL 10 MG/ML IV BOLUS
INTRAVENOUS | Status: DC | PRN
  Administered 2013-08-07: 100 mg via INTRAVENOUS

## 2013-08-07 MED ORDER — ALUM & MAG HYDROXIDE-SIMETH 200-200-20 MG/5ML PO SUSP: 30.0000 mL | ORAL | Status: DC | PRN

## 2013-08-07 MED ORDER — METHOCARBAMOL 500 MG PO TABS
ORAL_TABLET | ORAL | Status: AC
Start: 2013-08-07 — End: 2013-08-08
  Filled 2013-08-07: qty 1

## 2013-08-07 MED ORDER — METOCLOPRAMIDE HCL 10 MG PO TABS: 5.0000 mg | ORAL_TABLET | Freq: Three times a day (TID) | ORAL | Status: DC | PRN

## 2013-08-07 MED ORDER — FENTANYL CITRATE 0.05 MG/ML IJ SOLN
INTRAMUSCULAR | Status: DC | PRN
  Administered 2013-08-07: 50 ug via INTRAVENOUS
  Administered 2013-08-07: 150 ug via INTRAVENOUS

## 2013-08-07 MED ORDER — METOCLOPRAMIDE HCL 5 MG/ML IJ SOLN: 5.0000 mg | Freq: Three times a day (TID) | INTRAMUSCULAR | Status: DC | PRN

## 2013-08-07 MED ORDER — ONDANSETRON HCL 4 MG/2ML IJ SOLN
INTRAMUSCULAR | Status: DC | PRN
  Administered 2013-08-07: 4 mg via INTRAVENOUS

## 2013-08-07 MED ORDER — ONDANSETRON HCL 4 MG/2ML IJ SOLN
INTRAMUSCULAR | Status: AC
  Filled 2013-08-07: qty 2

## 2013-08-07 MED ORDER — FENTANYL CITRATE 0.05 MG/ML IJ SOLN
INTRAMUSCULAR | Status: AC
  Filled 2013-08-07: qty 5

## 2013-08-07 MED ORDER — OXYCODONE HCL 5 MG PO TABS
5.0000 mg | ORAL_TABLET | ORAL | Status: DC | PRN
  Administered 2013-08-07 – 2013-08-09 (×7): 10 mg via ORAL
  Filled 2013-08-07 (×8): qty 2

## 2013-08-07 MED ORDER — POLYETHYLENE GLYCOL 3350 17 G PO PACK: 17.0000 g | PACK | Freq: Every day | ORAL | Status: DC | PRN

## 2013-08-07 MED ORDER — RAMIPRIL 10 MG PO CAPS
10.0000 mg | ORAL_CAPSULE | Freq: Every day | ORAL | Status: DC
  Administered 2013-08-07 – 2013-08-10 (×4): 10 mg via ORAL
  Filled 2013-08-07 (×4): qty 1

## 2013-08-07 MED ORDER — PHENOL 1.4 % MT LIQD: 1.0000 | OROMUCOSAL | Status: DC | PRN

## 2013-08-07 MED ORDER — 0.9 % SODIUM CHLORIDE (POUR BTL) OPTIME
TOPICAL | Status: DC | PRN
  Administered 2013-08-07: 1000 mL

## 2013-08-07 MED ORDER — ASPIRIN EC 325 MG PO TBEC
325.0000 mg | DELAYED_RELEASE_TABLET | Freq: Two times a day (BID) | ORAL | Status: DC
  Administered 2013-08-08 – 2013-08-10 (×5): 325 mg via ORAL
  Filled 2013-08-07 (×8): qty 1

## 2013-08-07 MED ORDER — ONDANSETRON HCL 4 MG/2ML IJ SOLN: 4.0000 mg | Freq: Once | INTRAMUSCULAR | Status: DC | PRN

## 2013-08-07 MED ORDER — OXYCODONE HCL 5 MG PO TABS
ORAL_TABLET | ORAL | Status: AC
  Filled 2013-08-07: qty 1

## 2013-08-07 MED ORDER — DOCUSATE SODIUM 100 MG PO CAPS
100.0000 mg | ORAL_CAPSULE | Freq: Two times a day (BID) | ORAL | Status: DC
  Administered 2013-08-07 – 2013-08-10 (×6): 100 mg via ORAL
  Filled 2013-08-07 (×8): qty 1

## 2013-08-07 SURGICAL SUPPLY — 55 items
BANDAGE GAUZE ELAST BULKY 4 IN (GAUZE/BANDAGES/DRESSINGS) IMPLANT
BENZOIN TINCTURE PRP APPL 2/3 (GAUZE/BANDAGES/DRESSINGS) ×2 IMPLANT
BLADE SAW SGTL 18X1.27X75 (BLADE) ×2 IMPLANT
BLADE SURG ROTATE 9660 (MISCELLANEOUS) ×2 IMPLANT
CAPT HIP PF MOP ×2 IMPLANT
CELLS DAT CNTRL 66122 CELL SVR (MISCELLANEOUS) ×1 IMPLANT
COVER SURGICAL LIGHT HANDLE (MISCELLANEOUS) ×2 IMPLANT
DRAPE C-ARM 42X72 X-RAY (DRAPES) ×2 IMPLANT
DRAPE STERI IOBAN 125X83 (DRAPES) ×2 IMPLANT
DRAPE U-SHAPE 47X51 STRL (DRAPES) ×6 IMPLANT
DRSG AQUACEL AG ADV 3.5X10 (GAUZE/BANDAGES/DRESSINGS) ×2 IMPLANT
DURAPREP 26ML APPLICATOR (WOUND CARE) ×2 IMPLANT
ELECT BLADE 4.0 EZ CLEAN MEGAD (MISCELLANEOUS) ×2
ELECT BLADE 6.5 EXT (BLADE) IMPLANT
ELECT CAUTERY BLADE 6.4 (BLADE) IMPLANT
ELECT REM PT RETURN 9FT ADLT (ELECTROSURGICAL) ×2
ELECTRODE BLDE 4.0 EZ CLN MEGD (MISCELLANEOUS) ×1 IMPLANT
ELECTRODE REM PT RTRN 9FT ADLT (ELECTROSURGICAL) ×1 IMPLANT
FACESHIELD WRAPAROUND (MASK) ×6 IMPLANT
GLOVE BIOGEL PI IND STRL 6.5 (GLOVE) ×1 IMPLANT
GLOVE BIOGEL PI IND STRL 8 (GLOVE) ×2 IMPLANT
GLOVE BIOGEL PI INDICATOR 6.5 (GLOVE) ×1
GLOVE BIOGEL PI INDICATOR 8 (GLOVE) ×2
GLOVE BIOGEL PI ORTHO PRO SZ7 (GLOVE) ×1
GLOVE ECLIPSE 7.5 STRL STRAW (GLOVE) ×2 IMPLANT
GLOVE ECLIPSE 8.0 STRL XLNG CF (GLOVE) ×2 IMPLANT
GLOVE ORTHO TXT STRL SZ7.5 (GLOVE) ×4 IMPLANT
GLOVE PI ORTHO PRO STRL SZ7 (GLOVE) ×1 IMPLANT
GLOVE SURG SS PI 6.5 STRL IVOR (GLOVE) ×2 IMPLANT
GOWN STRL REUS W/ TWL XL LVL3 (GOWN DISPOSABLE) ×4 IMPLANT
GOWN STRL REUS W/TWL XL LVL3 (GOWN DISPOSABLE) ×4
HANDPIECE INTERPULSE COAX TIP (DISPOSABLE) ×1
KIT BASIN OR (CUSTOM PROCEDURE TRAY) ×2 IMPLANT
KIT ROOM TURNOVER OR (KITS) ×2 IMPLANT
MANIFOLD NEPTUNE II (INSTRUMENTS) ×2 IMPLANT
NS IRRIG 1000ML POUR BTL (IV SOLUTION) ×2 IMPLANT
PACK TOTAL JOINT (CUSTOM PROCEDURE TRAY) ×2 IMPLANT
PAD ARMBOARD 7.5X6 YLW CONV (MISCELLANEOUS) ×4 IMPLANT
RTRCTR WOUND ALEXIS 18CM MED (MISCELLANEOUS) ×2
SET HNDPC FAN SPRY TIP SCT (DISPOSABLE) ×1 IMPLANT
SPONGE LAP 18X18 X RAY DECT (DISPOSABLE) IMPLANT
SPONGE LAP 4X18 X RAY DECT (DISPOSABLE) IMPLANT
STRIP CLOSURE SKIN 1/2X4 (GAUZE/BANDAGES/DRESSINGS) ×2 IMPLANT
SUT ETHIBOND NAB CT1 #1 30IN (SUTURE) ×4 IMPLANT
SUT MNCRL AB 4-0 PS2 18 (SUTURE) ×2 IMPLANT
SUT VIC AB 0 CT1 27 (SUTURE) ×1
SUT VIC AB 0 CT1 27XBRD ANBCTR (SUTURE) ×1 IMPLANT
SUT VIC AB 1 CT1 27 (SUTURE) ×1
SUT VIC AB 1 CT1 27XBRD ANBCTR (SUTURE) ×1 IMPLANT
SUT VIC AB 2-0 CT1 27 (SUTURE) ×1
SUT VIC AB 2-0 CT1 TAPERPNT 27 (SUTURE) ×1 IMPLANT
TOWEL OR 17X24 6PK STRL BLUE (TOWEL DISPOSABLE) ×2 IMPLANT
TOWEL OR 17X26 10 PK STRL BLUE (TOWEL DISPOSABLE) ×2 IMPLANT
TRAY FOLEY CATH 16FRSI W/METER (SET/KITS/TRAYS/PACK) IMPLANT
WATER STERILE IRR 1000ML POUR (IV SOLUTION) IMPLANT

## 2013-08-07 NOTE — Anesthesia Preprocedure Evaluation (Addendum)
Anesthesia Evaluation  Patient identified by MRN, date of birth, ID band Patient awake    Reviewed: Allergy & Precautions, H&P , NPO status , Patient's Chart, lab work & pertinent test results  Airway Mallampati: I TM Distance: >3 FB Neck ROM: Full    Dental  (+) Teeth Intact, Caps   Pulmonary former smoker,          Cardiovascular hypertension, Pt. on home beta blockers + dysrhythmias Atrial Fibrillation     Neuro/Psych    GI/Hepatic   Endo/Other    Renal/GU      Musculoskeletal   Abdominal   Peds  Hematology   Anesthesia Other Findings   Reproductive/Obstetrics                        Anesthesia Physical Anesthesia Plan  ASA: III  Anesthesia Plan: General   Post-op Pain Management:    Induction: Intravenous  Airway Management Planned: Oral ETT  Additional Equipment:   Intra-op Plan:   Post-operative Plan: Extubation in OR  Informed Consent: I have reviewed the patients History and Physical, chart, labs and discussed the procedure including the risks, benefits and alternatives for the proposed anesthesia with the patient or authorized representative who has indicated his/her understanding and acceptance.   Dental advisory given  Plan Discussed with: CRNA and Surgeon  Anesthesia Plan Comments:        Anesthesia Quick Evaluation

## 2013-08-07 NOTE — Anesthesia Postprocedure Evaluation (Signed)
  Anesthesia Post-op Note  Patient: Mark P Keenum  Procedure(s) Performed: Procedure(s): RIGHT TOTAL HIP ARTHROPLASTY ANTERIOR APPROACH (Right)  Patient Location: PACU  Anesthesia Type:General  Level of Consciousness: awake, alert  and oriented  Airway and Oxygen Therapy: Patient Spontanous Breathing and Patient connected to nasal cannula oxygen  Post-op Pain: mild  Post-op Assessment: Post-op Vital signs reviewed, Patient's Cardiovascular Status Stable, Respiratory Function Stable, Patent Airway and Pain level controlled  Post-op Vital Signs: stable  Last Vitals:  Filed Vitals:   08/07/13 1545  BP: 155/85  Pulse: 51  Temp:   Resp: 10    Complications: No apparent anesthesia complications

## 2013-08-07 NOTE — Progress Notes (Signed)
Care of pt assumed by MA Valene Villa RN from D. Walker RN 

## 2013-08-07 NOTE — Plan of Care (Signed)
Problem: Consults Goal: Diagnosis- Total Joint Replacement Primary Total Hip     

## 2013-08-07 NOTE — Evaluation (Signed)
Physical Therapy Evaluation Patient Details Name: Colton Montgomery MRN: [id number] DOB: 1940-09-21 Today's Date: 08/07/2013   History of Present Illness  73 y.o. male [facility] for elective R direct anterior THA.  He has significant PMhx of bil TKA, bil hernia repair, PVCs, CAD, HTN, non ischemic cardiomyopathy, a-fib, and L THA.    Clinical Impression  Pt is POD #0 and moving with min guard assist and RW for OOB mobility.  I anticipate he will progress well enough to go home with HHPT f/u at d/c.   PT to follow acutely for deficits listed below.       Follow Up Recommendations Home health PT    Equipment Recommendations  None recommended by PT    Recommendations for Other Services   NA     Precautions / Restrictions Restrictions Weight Bearing Restrictions: No RLE Weight Bearing: Weight bearing as tolerated      Mobility  Bed Mobility Overal bed mobility: Needs Assistance Bed Mobility: Supine to Sit     Supine to sit: Supervision     General bed mobility comments: supervision for safety and verbal cues for technique  Transfers Overall transfer level: Needs assistance Equipment used: Rolling walker (2 wheeled) Transfers: Sit to/from Stand Sit to Stand: Min guard         General transfer comment: min guard assit for safety   Ambulation/Gait Ambulation/Gait assistance: Min guard Ambulation Distance (Feet): 5 Feet Assistive device: Rolling walker (2 wheeled) Gait Pattern/deviations: Step-to pattern;Antalgic     General Gait Details: verbal cues for leg sequencing.  Pt also needed cues for correct hand placement on RW.  He generally is not using his hands for much support while taking steps this evening.                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  3/5 ankle, 3/5 knee, 2+/5 hip    Cervical / Trunk Assessment: Normal  Communication   Communication: No difficulties  Cognition Arousal/Alertness: Awake/alert Behavior During Therapy: WFL for tasks assessed/performed Overall Cognitive Status: Within Functional Limits for tasks assessed                         Exercises Total Joint Exercises Ankle Circles/Pumps: AROM;Both;10 reps;Supine      Assessment/Plan    PT Assessment Patient needs continued PT services  PT Diagnosis Difficulty walking;Generalized weakness;Abnormality of gait;Acute pain   PT Problem List Decreased strength;Decreased range of motion;Decreased activity tolerance;Decreased balance;Decreased mobility;Decreased knowledge of use of DME;Pain  PT Treatment Interventions DME instruction;Gait training;Stair training;Functional mobility training;Therapeutic activities;Therapeutic exercise;Balance training;Patient/family education;Modalities   PT Goals (Current goals can be found  in the Care Plan section) Acute Rehab PT Goals Patient Stated Goal: to go home at d/c PT Goal Formulation: With patient Time For Goal Achievement: 08/14/13 Potential to Achieve Goals: Good    Frequency 7X/week   Barriers to discharge Decreased caregiver support pt has no one coming to help him at home at d/c       End of Session   Activity Tolerance: Patient limited by pain Patient left: in  chair;with call bell/phone within reach           Time: 1708-1740 PT Time Calculation (min): 32 min   Charges:   PT Evaluation $Initial PT Evaluation Tier I: 1 Procedure PT Treatments $Therapeutic Activity: 8-22 mins        Edgard Debord B. Avalynne Diver, PT, DPT [phone]   08/07/2013, 5:52 PM
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
## 2013-08-07 NOTE — Transfer of Care (Signed)
Immediate Anesthesia Transfer of Care Note  Patient: Colton Montgomery  Procedure(s) Performed: Procedure(s): RIGHT TOTAL HIP ARTHROPLASTY ANTERIOR APPROACH (Right)  Patient Location: PACU  Anesthesia Type:General  Level of Consciousness: awake, alert  and oriented  Airway & Oxygen Therapy: Patient Spontanous Breathing and Patient connected to nasal cannula oxygen  Post-op Assessment: Report given to PACU RN, Post -op Vital signs reviewed and stable and Patient moving all extremities  Post vital signs: Reviewed and stable  Complications: No apparent anesthesia complications

## 2013-08-07 NOTE — Brief Op Note (Signed)
08/07/2013  2:32 PM  PATIENT:  Colton Montgomery  73 y.o. male  PRE-OPERATIVE DIAGNOSIS:  Severe osteoarthritis right hip  POST-OPERATIVE DIAGNOSIS:  Severe osteoarthritis right hip  PROCEDURE:  Procedure(s): RIGHT TOTAL HIP ARTHROPLASTY ANTERIOR APPROACH (Right)  SURGEON:  Surgeon(s) and Role:    * Kathryne Hitch, MD - Primary  PHYSICIAN ASSISTANT: Rexene Edison, PA-C  ANESTHESIA:   general  EBL:    <  500 cc  BLOOD ADMINISTERED:none  DRAINS: none   LOCAL MEDICATIONS USED:  NONE  SPECIMEN:  No Specimen  DISPOSITION OF SPECIMEN:  N/A  COUNTS:  YES  TOURNIQUET:  * No tourniquets in log *  DICTATION: .Other Dictation: Dictation Number [id number]  PLAN OF CARE: Admit to inpatient   PATIENT DISPOSITION:  PACU - hemodynamically stable.   Delay start of Pharmacological VTE agent (>24hrs) due to surgical blood loss or risk of bleeding: no

## 2013-08-07 NOTE — Anesthesia Procedure Notes (Signed)
Procedure Name: Intubation Date/Time: 08/07/2013 12:54 PM Performed by: Sharlene Dory E Pre-anesthesia Checklist: Patient identified, Emergency Drugs available, Suction available, Patient being monitored and Timeout performed Patient Re-evaluated:Patient Re-evaluated prior to inductionOxygen Delivery Method: Circle system utilized Preoxygenation: Pre-oxygenation with 100% oxygen Intubation Type: IV induction Ventilation: Mask ventilation without difficulty Laryngoscope Size: Mac and 3 Grade View: Grade I Tube type: Oral Tube size: 7.5 mm Number of attempts: 1 Airway Equipment and Method: Stylet Placement Confirmation: ETT inserted through vocal cords under direct vision,  positive ETCO2 and breath sounds checked- equal and bilateral Secured at: 22 cm Tube secured with: Tape Dental Injury: Teeth and Oropharynx as per pre-operative assessment

## 2013-08-08 LAB — CBC
HCT: 40.4 % (ref 39.0–52.0)
Hemoglobin: 13.4 g/dL (ref 13.0–17.0)
MCH: 30.7 pg (ref 26.0–34.0)
MCHC: 33.2 g/dL (ref 30.0–36.0)
MCV: 92.4 fL (ref 78.0–100.0)
Platelets: 177 10*3/uL (ref 150–400)
RBC: 4.37 MIL/uL (ref 4.22–5.81)
RDW: 13.1 % (ref 11.5–15.5)
WBC: 11.3 10*3/uL — ABNORMAL HIGH (ref 4.0–10.5)

## 2013-08-08 LAB — BASIC METABOLIC PANEL
BUN: 16 mg/dL (ref 6–23)
CO2: 29 mEq/L (ref 19–32)
Calcium: 9 mg/dL (ref 8.4–10.5)
Chloride: 101 mEq/L (ref 96–112)
Creatinine, Ser: 0.78 mg/dL (ref 0.50–1.35)
GFR calc Af Amer: >90 mL/min (ref >90)
GFR calc non Af Amer: 89 mL/min — ABNORMAL LOW (ref >90)
Glucose, Bld: 122 mg/dL — ABNORMAL HIGH (ref 70–99)
Potassium: 4.7 mEq/L (ref 3.7–5.3)
Sodium: 138 mEq/L (ref 137–147)

## 2013-08-08 MED ORDER — KETOROLAC TROMETHAMINE 30 MG/ML IJ SOLN
30.0000 mg | Freq: Once | INTRAMUSCULAR | Status: AC
  Administered 2013-08-08: 30 mg via INTRAVENOUS
  Filled 2013-08-08: qty 1

## 2013-08-08 MED ORDER — OXYCODONE HCL ER 20 MG PO T12A
20.0000 mg | EXTENDED_RELEASE_TABLET | Freq: Two times a day (BID) | ORAL | Status: DC
  Administered 2013-08-08: 20 mg via ORAL
  Filled 2013-08-08: qty 2

## 2013-08-08 NOTE — Progress Notes (Signed)
OT Cancellation Note  Patient Details Name: REVIN CORKER MRN: [id number] DOB: 02/14/1941   Cancelled Treatment:    Reason Eval/Treat Not Completed: OT screened, no needs identified, will sign off. All DME and AE needs are met. Pt had previous hip replacement last year and feel comfortable with all ADLs at home.   Lyda Perone [phone] 08/08/2013, 10:46 AM

## 2013-08-08 NOTE — Progress Notes (Signed)
Physical Therapy Treatment Patient Details Name: Colton Montgomery MRN: [id number] DOB: [birth year] Today's Date: 08/08/2013    History of Present Illness 73 y.o. male admitted to [facility] on 08/07/13 for elective R direct anterior THA.  He has significant PMhx of bil TKA, bil hernia repair, PVCs, CAD, HTN, non ischemic cardiomyopathy, a-fib, and L THA.      PT Comments    Patient agreeable to work with therapy with max encouragement. Patient very in control of what he will do and is familiar with techniques but needs cues for safety. Continue with current POC  Follow Up Recommendations  Home health PT     Equipment Recommendations  None recommended by PT    Recommendations for Other Services       Precautions / Restrictions Precautions Precautions: None Restrictions Weight Bearing Restrictions: Yes RLE Weight Bearing: Weight bearing as tolerated    Mobility  Bed Mobility Overal bed mobility: Needs Assistance       Supine to sit: Supervision     General bed mobility comments: supervision for safety and verbal cues for technique  Transfers Overall transfer level: Needs assistance Equipment used: Rolling walker (2 wheeled)   Sit to Stand: Min guard         General transfer comment: MinGuard for safety  Ambulation/Gait Ambulation/Gait assistance: Min guard Ambulation Distance (Feet): 30 Feet Assistive device: Rolling walker (2 wheeled) Gait Pattern/deviations: Step-to pattern;Decreased step length - right;Decreased step length - left Gait velocity: decreased   General Gait Details: Cues for positioning inside of RW   Stairs            Wheelchair Mobility    Modified Rankin (Stroke Patients Only)       Balance                                    Cognition Arousal/Alertness: Awake/alert Behavior During Therapy: WFL for tasks assessed/performed Overall Cognitive Status: Within Functional Limits for tasks assessed                       Exercises Total Joint Exercises Heel Slides: AAROM;Right;10 reps Hip ABduction/ADduction: AAROM;Right;10 reps Long Arc Quad: AROM;Right;10 reps    General Comments        Pertinent Vitals/Pain Patient stated pain but did not rate. patient repositioned for comfort      Home Living                      Prior Function            PT Goals (current goals can now be found in the care plan section) Progress towards PT goals: Progressing toward goals    Frequency  7X/week    PT Plan Current plan remains appropriate    Co-evaluation             End of Session Equipment Utilized During Treatment: Gait belt Activity Tolerance: Patient tolerated treatment well Patient left: in chair;with call bell/phone within reach     Time: 1002-1022 PT Time Calculation (min): 20 min  Charges:  $Gait Training: 8-22 mins                    G Codes:      Fredrich Birks 08/08/2013, 10:47 AM  08/08/2013 Fredrich Birks PTA [phone] pager [phone] office

## 2013-08-08 NOTE — Progress Notes (Signed)
Orthopedic Tech Progress Note Patient Details:  Colton Montgomery 1940-06-30 [id number]  Ortho Devices Ortho Device/Splint Location: ovehread frame on bed Ortho Device/Splint Interventions: Ordered;Application   Jennye Moccasin 08/08/2013, 7:54 PM

## 2013-08-08 NOTE — Progress Notes (Signed)
Utilization review completed.  

## 2013-08-08 NOTE — Progress Notes (Signed)
PT Cancellation Note  Patient Details Name: Colton Montgomery MRN: [id number] DOB: 03/03/1940   Cancelled Treatment:    Reason Eval/Treat Not Completed: Fatigue/lethargy limiting ability to participate. Patient had just gotten back to bed with nursing staff earlier and refused any further mobility with PT. Will follow up tomorrow   Fredrich Birks 08/08/2013, 2:43 PM

## 2013-08-08 NOTE — Progress Notes (Signed)
Agree with note. 08/08/2013 Martie Round, OTR/L Pager: [phone]

## 2013-08-08 NOTE — Op Note (Signed)
NAMEADIEL, HORNG               ACCOUNT NO.:  [id number]  MEDICAL RECORD NO.:  [id number]  LOCATION:  [id number]                        FACILITY:  [facility]  PHYSICIAN:  Vanita Panda. Magnus Ivan, M.D.DATE OF BIRTH:  11/16/1940  DATE OF PROCEDURE:  08/07/2013 DATE OF DISCHARGE:                              OPERATIVE REPORT   PREOPERATIVE DIAGNOSES:  Severe end-stage arthritis and degenerative joint disease, right hip.  POSTOPERATIVE DIAGNOSES:  Severe end-stage arthritis and degenerative joint disease, right hip.  PROCEDURE:  Right total hip arthroplasty through direct anterior approach.  IMPLANTS:  DePuy Sector Gription acetabular component size 54, size 36+ 4 neutral polyethylene liner, size 18 Corail femoral component with varus offset, size 36 -2 metal hip ball.  SURGEON:  Vanita Panda. Magnus Ivan, M.D.  ASSISTANT:  Richardean Canal, P.A.  ANESTHESIA:  General.  BLOOD LOSS:  Less than 500 mL.  ANTIBIOTICS:  2 g of IV Ancef.  COMPLICATIONS:  None.  INDICATIONS:  [name] is a 73 year old gentleman with bilateral hip osteoarthritis.  He had a successful left total hip arthroplasty last year.  He now presents to have his right hip done.  He has x-rays that show complete loss of the joint space with some collapse of the femoral head.  He has daily pain.  He has decreased mobility and decreased quality of life because of this.  He understands the risks of surgery including acute blood loss anemia, nerve and vessel injury, fracture, infection, dislocation, and DVT.  He understands the goals are decreased pain, improved mobility, and overall improved quality of life.  PROCEDURE DESCRIPTION:  After informed consent was obtained, the right hip was marked.  He was brought to the operating room.  General anesthesia was obtained while he was on the stretcher.  Traction boots were placed on both his feet and a Foley catheter was placed as well. Next, he was placed supine on the HANA  fracture table with the perineal post in place and both legs in inline skeletal traction devices, but no traction applied.  His right operative hip was prepped and draped with DuraPrep and sterile drapes.  A time-out was called, he was identified as correct patient, correct right hip.  I then made an incision inferior and posterior to the anterior superior iliac spine.  I dissected down to the tensor fascia lata muscle.  The tensor fascia was then divided longitudinally and I proceeded with a direct anterior approach to the hip.  Cobra retractor was placed around the lateral neck and around the medial neck.  I cauterized the lateral femoral circumflex vessels.  I opened up the hip capsule in a L-type format and found a large joint effusion.  I placed the Cobra retractors within the hip capsule.  I then made my femoral neck cut with an oscillating saw proximal to the lesser trochanter and completed this with an osteotome.  I placed a corkscrew guide in the femoral head and removed the femoral head in its entirety found it to be devoid of cartilage.  I cleaned the acetabulum of debris including remnants of acetabular labrum.  I placed a bent Hohmann medially and a Cobra retractor laterally and began reaming from a size  42 reamer in 2 mm increments all the way up to size 52 with all reamers under direct visualization and the last reamer also under direct fluoroscopy so we could obtain our depth of reaming, inclination and anteversion.  Once I was pleased with this position, I placed the real DePuy Sector Gription acetabular component size 54 with the apex hole eliminator guide and size 36+ 4 neutral liner polyethylene for size 54 acetabular component.  Attention was then turned to the femur.  With the leg externally rotated to 100 degrees, extended and adducted, I was able to place a Mueller retractor medially and a Hohmann retractor behind the greater trochanter.  I released the lateral  joint capsule and used a box cutting osteotome to enter the femoral canal.  Then I started broaching from a size 8 broach all the way up to a size 18 given his thin bone and wide open canal.  With size 18, we trialed a varus offset neck to match his other side, a 36 -2 hip ball due to him being a higher neck, I reduced this in acetabulum and it was stable.  His offset appeared equal under direct fluoroscopy as well as his leg length.  He had good rotation as well.  We then dislocated the hip and removed the trial components.  I placed the real Corail femoral component from DePuy size 8 with varus offset and the real 36 -2 metal hip ball, again we reduced this and acetabulum was stable.  We copiously irrigated the soft tissues with normal saline solution using pulsatile lavage.  We then closed the joint capsule with interrupted #1 Ethibond suture followed by a running #1 Vicryl in the tensor fascia, 0-Vicryl in the deep tissue, 2-0 Vicryl in the subcutaneous tissue, 4-0 Monocryl subcuticular stitch.  Steri- Strips and Aquacel dressing.  He was taken off the HANA table, awakened, extubated, and taken to the recovery room in a stable condition.  All final counts were correct and there were no complications noted.  Of note, Kriste BasqueGill Clark, PA-C assisted during the entire case and his assistance was crucial for facilitating this case.     Vanita Pandahristopher Y. Magnus IvanBlackman, M.D.     CYB/MEDQ  D:  08/07/2013  T:  08/08/2013  Job:  [id number]

## 2013-08-08 NOTE — Progress Notes (Signed)
Subjective: 1 Day Post-Op Procedure(s) (LRB): RIGHT TOTAL HIP ARTHROPLASTY ANTERIOR APPROACH (Right) Patient reports pain as moderate.    Objective: Vital signs in last 24 hours: Temp:  [97 F (36.1 C)-98.1 F (36.7 C)] 98.1 F (36.7 C) (06/10 0553) Pulse Rate:  [40-69] 40 (06/10 0553) Resp:  [7-18] 16 (06/10 0553) BP: (101-172)/(60-88) 101/60 mmHg (06/10 0553) SpO2:  [70 %-100 %] 99 % (06/10 0553) Weight:  [73.936 kg (163 lb)] 73.936 kg (163 lb) (06/09 1109)  Intake/Output from previous day: 06/09 0701 - 06/10 0700 In: 1840 [P.O.:315; I.V.:1525] Out: 700 [Urine:700] Intake/Output this shift:    No results found for this basename: HGB,  in the last 72 hours No results found for this basename: WBC, RBC, HCT, PLT,  in the last 72 hours No results found for this basename: NA, K, CL, CO2, BUN, CREATININE, GLUCOSE, CALCIUM,  in the last 72 hours No results found for this basename: LABPT, INR,  in the last 72 hours  Sensation intact distally Intact pulses distally Dorsiflexion/Plantar flexion intact Incision: scant drainage  Assessment/Plan: 1 Day Post-Op Procedure(s) (LRB): RIGHT TOTAL HIP ARTHROPLASTY ANTERIOR APPROACH (Right) Up with therapy  Neena Beecham Y 08/08/2013, 7:09 AM

## 2013-08-09 ENCOUNTER — Encounter (HOSPITAL_COMMUNITY): Payer: Self-pay | Admitting: Orthopaedic Surgery

## 2013-08-09 LAB — CBC
HCT: 39.5 % (ref 39.0–52.0)
Hemoglobin: 13.2 g/dL (ref 13.0–17.0)
MCH: 30.8 pg (ref 26.0–34.0)
MCHC: 33.4 g/dL (ref 30.0–36.0)
MCV: 92.3 fL (ref 78.0–100.0)
Platelets: 129 10*3/uL — ABNORMAL LOW (ref 150–400)
RBC: 4.28 MIL/uL (ref 4.22–5.81)
RDW: 13.2 % (ref 11.5–15.5)
WBC: 12.8 10*3/uL — ABNORMAL HIGH (ref 4.0–10.5)

## 2013-08-09 MED ORDER — OXYCODONE-ACETAMINOPHEN 5-325 MG PO TABS: 1.0000 | ORAL_TABLET | ORAL | Status: DC | PRN

## 2013-08-09 MED ORDER — ASPIRIN 325 MG PO TBEC: 325.0000 mg | DELAYED_RELEASE_TABLET | Freq: Two times a day (BID) | ORAL | Status: DC

## 2013-08-09 NOTE — Progress Notes (Signed)
Physical Therapy Treatment Patient Details Name: Colton Montgomery MRN: [id number] DOB: Jul 11, 1940 Today's Date: 08/09/2013    History of Present Illness 73 y.o. male admitted to [facility] on 08/07/13 for elective R direct anterior THA.  He has significant PMhx of bil TKA, bil hernia repair, PVCs, CAD, HTN, non ischemic cardiomyopathy, a-fib, and L THA.      PT Comments    Patient able to work on stairs this session. Patient stated that he can stay on the first level as need be and would only have one step to enter his house. Patient is planning to DC home tomorrow  Follow Up Recommendations  Home health PT     Equipment Recommendations  None recommended by PT    Recommendations for Other Services       Precautions / Restrictions Precautions Precautions: None Restrictions RLE Weight Bearing: Weight bearing as tolerated    Mobility  Bed Mobility Overal bed mobility: Needs Assistance Bed Mobility: Supine to Sit     Supine to sit: Min assist     General bed mobility comments: A for trunk support into sitting  Transfers Overall transfer level: Needs assistance Equipment used: Rolling walker (2 wheeled)   Sit to Stand: Supervision         General transfer comment: Supervision for safety. Patient with correct technique  Ambulation/Gait Ambulation/Gait assistance: Supervision Ambulation Distance (Feet): 120 Feet Assistive device: Rolling walker (2 wheeled)   Gait velocity: decreased   General Gait Details: Cues for positioning inside of RW. Patient tends to keep RW too far out in front of him   Stairs Stairs: Yes Stairs assistance: Min assist Stair Management: One rail Right;Step to pattern Number of Stairs: 3 General stair comments: Min A for balance. Cues for technique  Wheelchair Mobility    Modified Rankin (Stroke Patients Only)       Balance                                    Cognition Arousal/Alertness: Awake/alert Behavior During  Therapy: WFL for tasks assessed/performed Overall Cognitive Status: Within Functional Limits for tasks assessed                      Exercises      General Comments        Pertinent Vitals/Pain no apparent distress     Home Living                      Prior Function            PT Goals (current goals can now be found in the care plan section) Progress towards PT goals: Progressing toward goals    Frequency  7X/week    PT Plan Current plan remains appropriate    Co-evaluation             End of Session Equipment Utilized During Treatment: Gait belt Activity Tolerance: Patient tolerated treatment well Patient left: in chair;with call bell/phone within reach     Time: 2423-5361 PT Time Calculation (min): 26 min  Charges:  $Gait Training: 23-37 mins                    G Codes:      Fredrich Birks 08/09/2013, 2:38 PM 08/09/2013 Fredrich Birks PTA [phone] pager [phone] office

## 2013-08-09 NOTE — Care Management Note (Signed)
CARE MANAGEMENT NOTE 08/09/2013  Patient:  Colton Montgomery   Account Number:  [id number]  Date Initiated:  08/09/2013  Documentation initiated by:  Vance Peper  Subjective/Objective Assessment:   73 yr old male s/p right total hip arthroplasty.     Action/Plan:   Case manager spoke with pateint concerning home health and DME needs at discharge, patient preoperatively setup with [facility],  no changes. Has a rolling walker and 3in1.   Anticipated DC Date:  08/10/2013   Anticipated DC Plan:  HOME W HOME HEALTH SERVICES      DC Planning Services  CM consult      [facility] Choice  HOME HEALTH   Choice offered to / List presented to:  C-1 Patient        HH arranged  HH-2 PT      [facility] agency  [facility]   Status of service:  Completed, signed off Medicare Important Message given?   (If response is "NO", the following Medicare IM given date fields will be blank) Date Medicare IM given:   Date Additional Medicare IM given:    Discharge Disposition:  HOME W HOME HEALTH SERVICES  Per UR Regulation:

## 2013-08-09 NOTE — Progress Notes (Signed)
Physical Therapy Treatment Patient Details Name: Colton Montgomery MRN: [id number] DOB: [birth year] Today's Date: 08/09/2013    History of Present Illness 73 y.o. male admitted to [facility] on 08/07/13 for elective R direct anterior THA.  He has significant PMhx of bil TKA, bil hernia repair, PVCs, CAD, HTN, non ischemic cardiomyopathy, a-fib, and L THA.      PT Comments    Patient progressing with ambulation. Reviewed HEP and gave handout. Would like to attempt stairs later today  Follow Up Recommendations  Home health PT     Equipment Recommendations  None recommended by PT    Recommendations for Other Services       Precautions / Restrictions Precautions Precautions: None Restrictions RLE Weight Bearing: Weight bearing as tolerated    Mobility  Bed Mobility               General bed mobility comments: Patient up in recliner upon entering room.   Transfers Overall transfer level: Needs assistance Equipment used: Rolling walker (2 wheeled)   Sit to Stand: Supervision         General transfer comment: Supervision for safety. Patient with correct technique  Ambulation/Gait Ambulation/Gait assistance: Supervision Ambulation Distance (Feet): 100 Feet Assistive device: Rolling walker (2 wheeled) Gait Pattern/deviations: Step-to pattern     General Gait Details: Cues for positioning inside of RW. Patient tends to keep RW too far out in front of him   Information systems manager Rankin (Stroke Patients Only)       Balance                                    Cognition Arousal/Alertness: Awake/alert Behavior During Therapy: WFL for tasks assessed/performed Overall Cognitive Status: Within Functional Limits for tasks assessed                      Exercises Total Joint Exercises Heel Slides: AAROM;Right;10 reps Hip ABduction/ADduction: AAROM;Right;10 reps Long Arc Quad: AROM;Right;10 reps    General  Comments        Pertinent Vitals/Pain 5/10 R knee pain    Home Living                      Prior Function            PT Goals (current goals can now be found in the care plan section) Progress towards PT goals: Progressing toward goals    Frequency  7X/week    PT Plan Current plan remains appropriate    Co-evaluation             End of Session Equipment Utilized During Treatment: Gait belt Activity Tolerance: Patient tolerated treatment well Patient left: in chair;with call bell/phone within reach     Time: 0904-0929 PT Time Calculation (min): 25 min  Charges:  $Gait Training: 8-22 mins $Therapeutic Exercise: 8-22 mins                    G Codes:      Fredrich Birks 08/09/2013, 10:08 AM 08/09/2013 Fredrich Birks PTA [phone] pager [phone] office

## 2013-08-09 NOTE — Progress Notes (Signed)
Subjective: 2 Days Post-Op Procedure(s) (LRB): RIGHT TOTAL HIP ARTHROPLASTY ANTERIOR APPROACH (Right) Patient reports pain as moderate.  A little hypotensive and bradycardic last evening, but asymptomatic.  Objective: Vital signs in last 24 hours: Temp:  [98.5 F (36.9 C)-99.4 F (37.4 C)] 98.5 F (36.9 C) (06/11 0621) Pulse Rate:  [41-78] 44 (06/11 0633) Resp:  [16-20] 16 (06/11 0621) BP: (88-99)/(45-57) 97/57 mmHg (06/11 0621) SpO2:  [92 %-100 %] 92 % (06/11 0621)  Intake/Output from previous day: 06/10 0701 - 06/11 0700 In: 1080 [P.O.:1080] Out: 275 [Urine:275] Intake/Output this shift: Total I/O In: 240 [P.O.:240] Out: 175 [Urine:175]   Recent Labs  08/08/13 0655  HGB 13.4    Recent Labs  08/08/13 0655  WBC 11.3*  RBC 4.37  HCT 40.4  PLT 177    Recent Labs  08/08/13 0655  NA 138  K 4.7  CL 101  CO2 29  BUN 16  CREATININE 0.78  GLUCOSE 122*  CALCIUM 9.0   No results found for this basename: LABPT, INR,  in the last 72 hours  Sensation intact distally Intact pulses distally Dorsiflexion/Plantar flexion intact Incision: dressing C/D/I  Assessment/Plan: 2 Days Post-Op Procedure(s) (LRB): RIGHT TOTAL HIP ARTHROPLASTY ANTERIOR APPROACH (Right) Up with therapy Plan for discharge tomorrow Follow-up on labs later today.  Colton Montgomery 08/09/2013, 6:50 AM

## 2013-08-09 NOTE — Discharge Instructions (Signed)
Pickup stool softener for constipation. °Weight Bearing as tolerated  °No hip precautions °Progress activities slowly °Expect hip and possible knee soreness and swelling. Apply heat or ice as needed. °Keep dressing clean dry and intact, may shower with dressing intact. On Tuesday remove dressing, incision can get wet. After showering apply clean dressing. °

## 2013-08-10 LAB — CBC
HCT: 36.4 % — ABNORMAL LOW (ref 39.0–52.0)
Hemoglobin: 12.1 g/dL — ABNORMAL LOW (ref 13.0–17.0)
MCH: 30.5 pg (ref 26.0–34.0)
MCHC: 33.2 g/dL (ref 30.0–36.0)
MCV: 91.7 fL (ref 78.0–100.0)
Platelets: 125 10*3/uL — ABNORMAL LOW (ref 150–400)
RBC: 3.97 MIL/uL — ABNORMAL LOW (ref 4.22–5.81)
RDW: 13 % (ref 11.5–15.5)
WBC: 8.6 10*3/uL (ref 4.0–10.5)

## 2013-08-10 NOTE — Progress Notes (Signed)
Patient ID: ADON SENN, male   DOB: 1940-04-23, 73 y.o.   MRN: [id number] Looks good overall.  AF/VSS. NVI.  Can D/D to home today.

## 2013-08-10 NOTE — Progress Notes (Signed)
Patient d/c to home, IV removed, prescriptions given, instructions reviewed. 

## 2013-08-10 NOTE — Progress Notes (Signed)
Physical Therapy Treatment Patient Details Name: Colton Montgomery MRN: [id number] DOB: 1940-11-15 Today's Date: 08/10/2013    History of Present Illness 73 y.o. male admitted to [facility] on 08/07/13 for elective R direct anterior THA.  He has significant PMhx of bil TKA, bil hernia repair, PVCs, CAD, HTN, non ischemic cardiomyopathy, a-fib, and L THA.      PT Comments    Patient eager to DC. Feels safe with previous surgery and knowing techniques. Declined further stair training this AM. Plan for DC later today  Follow Up Recommendations  Home health PT     Equipment Recommendations  None recommended by PT    Recommendations for Other Services       Precautions / Restrictions Precautions Precautions: None Restrictions RLE Weight Bearing: Weight bearing as tolerated    Mobility  Bed Mobility               General bed mobility comments: Patient sitting up in recliner upron entering room  Transfers Overall transfer level: Modified independent                  Ambulation/Gait Ambulation/Gait assistance: Modified independent (Device/Increase time) Ambulation Distance (Feet): 250 Feet Assistive device: Rolling walker (2 wheeled) Gait Pattern/deviations: Step-through pattern;Decreased stride length     General Gait Details: Cues for safety with RW   Stairs         General stair comments: Patient declined further training  Wheelchair Mobility    Modified Rankin (Stroke Patients Only)       Balance                                    Cognition Arousal/Alertness: Awake/alert Behavior During Therapy: WFL for tasks assessed/performed Overall Cognitive Status: Within Functional Limits for tasks assessed                      Exercises      General Comments        Pertinent Vitals/Pain no apparent distress     Home Living                      Prior Function            PT Goals (current goals can now be found  in the care plan section) Progress towards PT goals: Progressing toward goals    Frequency  7X/week    PT Plan Current plan remains appropriate    Co-evaluation             End of Session Equipment Utilized During Treatment: Gait belt Activity Tolerance: Patient tolerated treatment well Patient left: in chair;with call bell/phone within reach     Time: 1002-1016 PT Time Calculation (min): 14 min  Charges:  $Gait Training: 8-22 mins                    G Codes:      Fredrich Birks 08/10/2013, 11:39 AM  08/10/2013 Fredrich Birks PTA [phone] pager [phone] office

## 2013-08-10 NOTE — Discharge Summary (Signed)
Patient ID: Colton Montgomery MRN: [id number] DOB/AGE: October 04, 1940 73 y.o.  Admit date: 08/07/2013 Discharge date: 08/10/2013  Admission Diagnoses:  Principal Problem:   Arthritis of right hip Active Problems:   Status post THR (total hip replacement)   Discharge Diagnoses:  Same  Past Medical History  Diagnosis Date  . History of atrial fibrillation   . S/P ablation of atrial fibrillation APRIL 2012  . Hypothyroidism   . Nonischemic cardiomyopathy PER CATH REPORT 2005    EF 20% 2005, 45-50% by echo 03/2012  . DJD (degenerative joint disease)   . RBBB   . Hoarseness, chronic SECONARY TO LARNYX INJURY 1997    NO AIRWAY ISSUES  . Hydronephrosis, right   . Hematuria   . Hypertension   . Hyperlipidemia   . Kidney filling defect RIGHT  . PVC's (premature ventricular contractions)   . S/P total knee arthroplasty 08/23/2005    left [place]  . S/P total knee arthroplasty 08/26/2004    right [place]  . Coronary artery disease CARDIOLOGIST-  [facility]    no significant CAD by 2005 cath, normal nuclear stress 2010    Surgeries: Procedure(s): RIGHT TOTAL HIP ARTHROPLASTY ANTERIOR APPROACH on 08/07/2013   Consultants:    Discharged Condition: Improved  Hospital Course: Colton Montgomery is an 73 y.o. male who was admitted 08/07/2013 for operative treatment ofArthritis of right hip. Patient has severe unremitting pain that affects sleep, daily activities, and work/hobbies. After pre-op clearance the patient was taken to the operating room on 08/07/2013 and underwent  Procedure(s): RIGHT TOTAL HIP ARTHROPLASTY ANTERIOR APPROACH.    Patient was given perioperative antibiotics: Anti-infectives   Start     Dose/Rate Route Frequency Ordered Stop   08/07/13 1700  ceFAZolin (ANCEF) IVPB 1 g/50 mL premix     1 g 100 mL/hr over 30 Minutes Intravenous Every 6 hours 08/07/13 1650 08/07/13 2329   08/07/13 0600  ceFAZolin (ANCEF) IVPB 2 g/50 mL premix     2 g 100 mL/hr over 30  Minutes Intravenous On call to O.R. 08/06/13 1407 08/07/13 1258       Patient was given sequential compression devices, early ambulation, and chemoprophylaxis to prevent DVT.  Patient benefited maximally from hospital stay and there were no complications.    Recent vital signs: Patient Vitals for the past 24 hrs:  BP Temp Temp src Pulse Resp SpO2  08/10/13 0525 105/60 mmHg 98 F (36.7 C) Oral 73 16 99 %  08/09/13 2100 106/63 mmHg 98.3 F (36.8 C) Oral 75 16 95 %  08/09/13 1755 - - - 78 - -  08/09/13 1424 104/60 mmHg 97.9 F (36.6 C) Oral 74 16 94 %  08/09/13 1005 100/62 mmHg - - 62 - -     Recent laboratory studies:  Recent Labs  08/08/13 0655 08/09/13 0700 08/10/13 0610  WBC 11.3* 12.8* 8.6  HGB 13.4 13.2 12.1*  HCT 40.4 39.5 36.4*  PLT 177 129* 125*  NA 138  --   --   K 4.7  --   --   CL 101  --   --   CO2 29  --   --   BUN 16  --   --   CREATININE 0.78  --   --   GLUCOSE 122*  --   --   CALCIUM 9.0  --   --      Discharge Medications:     Medication List  aspirin 325 MG EC tablet  Take 1 tablet (325 mg total) by mouth 2 (two) times daily after a meal.     carvedilol 25 MG tablet  Commonly known as:  COREG  Take 12.5 mg by mouth 2 (two) times daily with a meal.     levothyroxine 50 MCG tablet  Commonly known as:  SYNTHROID, LEVOTHROID  Take 50 mcg by mouth daily before breakfast.     oxyCODONE-acetaminophen 5-325 MG per tablet  Commonly known as:  ROXICET  Take 1-2 tablets by mouth every 4 (four) hours as needed for severe pain.     ramipril 10 MG capsule  Commonly known as:  ALTACE  Take 10 mg by mouth daily.     simvastatin 40 MG tablet  Commonly known as:  ZOCOR  Take 40 mg by mouth daily.        Diagnostic Studies: Dg Hip Operative Right  2013/08/11   CLINICAL DATA:  Right hip replacement  EXAM: DG OPERATIVE RIGHT HIP  TECHNIQUE: A single spot fluoroscopic AP image of the right hip is submitted.  COMPARISON:  08/21/2012  FINDINGS:  There is now noted a right hip prosthesis in satisfactory position. No acute abnormality is noted. 33 seconds of fluoroscopy was utilized during this exam.   Electronically Signed   By: Alcide Clever M.D.   On: Aug 11, 2013 14:39   Dg Pelvis Portable  08-11-13   CLINICAL DATA:  Status post total hip arthroplasty on the right.  EXAM: PORTABLE PELVIS 1-2 VIEWS  COMPARISON:  August 21, 2012  FINDINGS: Total hip replacement on the left is stable. There is 90 total hip replacement on the right which is well seated. No fracture or dislocation is seen. There are stable exostoses along the lateral inferior left iliac crest. Postoperative air is seen on the right, an expected postoperative finding. Bones are osteoporotic.  IMPRESSION: Total hip prosthesis on the right appears well-seated on single view. Prosthesis on the left appears well-seated, stable. Stable exostoses laterally on the left. No acute fracture or dislocation. Bones osteoporotic.   Electronically Signed   By: Bretta Bang M.D.   On: 08/11/2013 16:02    Disposition: 01-Home or Self Care      Discharge Instructions   Discharge patient    Complete by:  As directed      Weight bearing as tolerated    Complete by:  As directed            Follow-up Information   Follow up with [facility]. (Someone from [facility] will contact you concerning start date and time for physical therapy.)    Contact information:   [address] [place] [address] [phone]       Follow up with Kathryne Hitch, MD In 2 weeks.   Specialty:  Orthopedic Surgery   Contact information:   [address] [place] [address] [phone]        Signed: Kathryne Hitch 08/10/2013, 6:58 AM

## 2013-09-03 ENCOUNTER — Other Ambulatory Visit: Payer: Self-pay | Admitting: Cardiovascular Disease

## 2013-09-03 NOTE — Telephone Encounter (Signed)
Rx was sent to pharmacy electronically. 

## 2013-09-14 NOTE — OR Nursing (Signed)
Late entry on type and subtype for procedural recording

## 2013-10-01 ENCOUNTER — Other Ambulatory Visit: Payer: Self-pay | Admitting: Cardiovascular Disease

## 2013-10-01 NOTE — Telephone Encounter (Signed)
Rx was sent to pharmacy electronically. 

## 2013-10-23 IMAGING — RF DG HIP OPERATIVE*L*
1 series · 2 of 2 positions shown · non-contrast
Comparison: None.

CLINICAL DATA: Total hip arthroplasty.

OPERATIVE LEFT HIP

[Series 1: run · 2 of 2 slices shown]
[im 1/2]
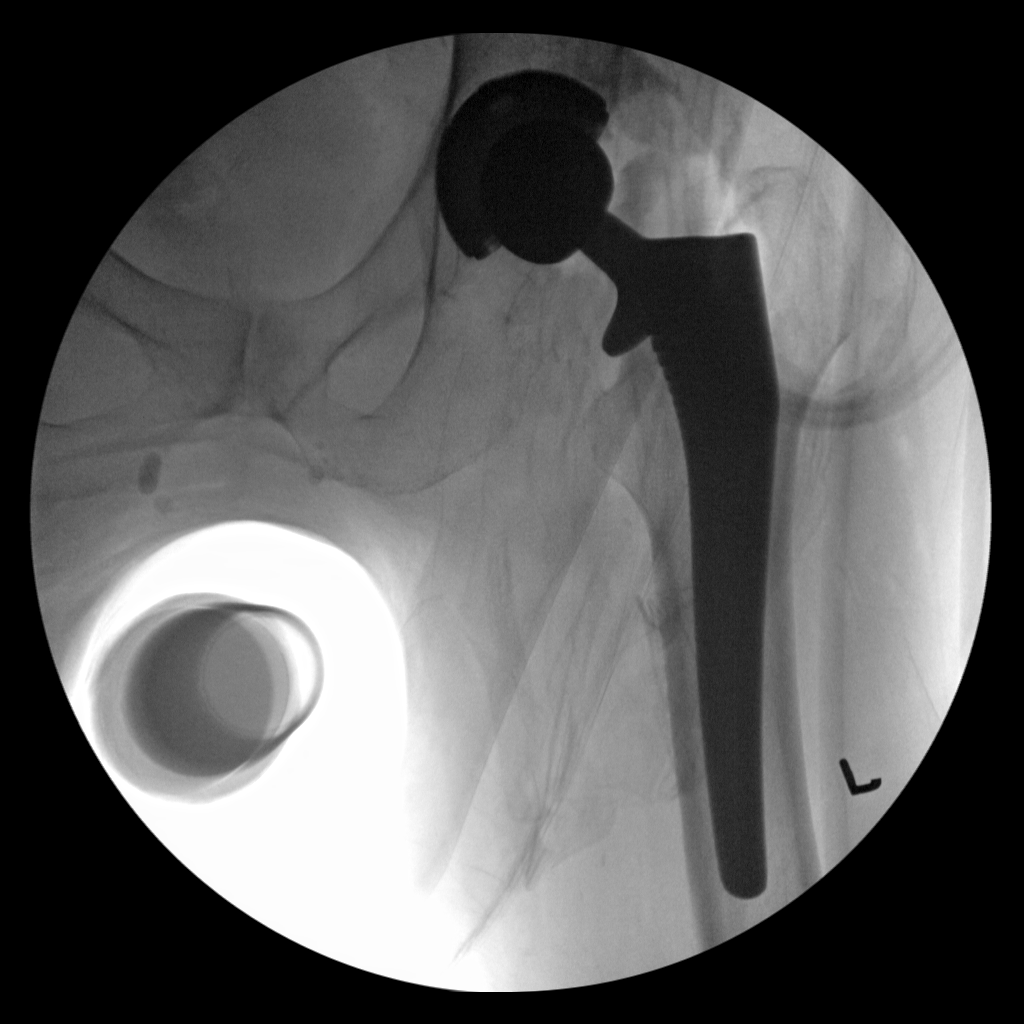
[im 2/2]
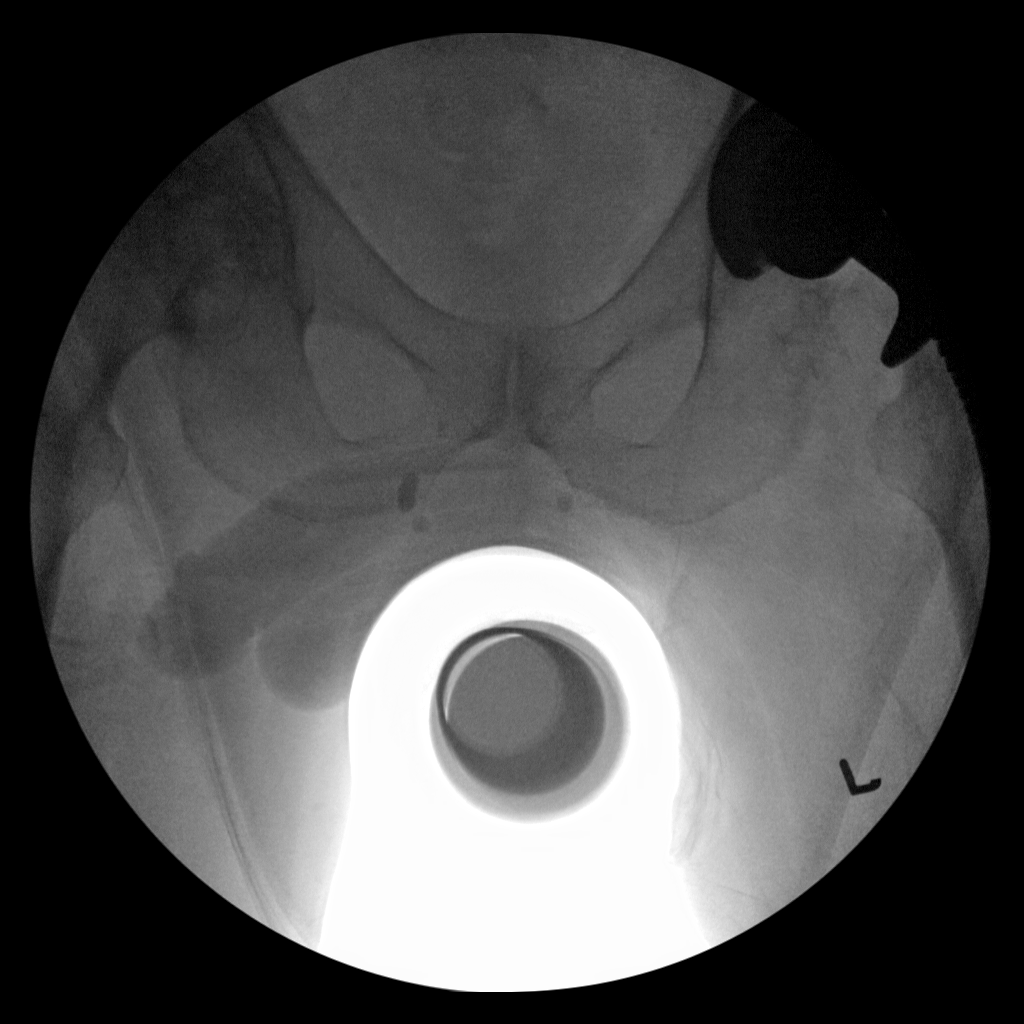

[2 of 2 positions shown; findings below may reference images not displayed]

FINDINGS: 2 intraoperative views.  These demonstrate a left hip
arthroplasty without acute complication.  Artifact degradation on
the initial film.
IMPRESSION: Intraoperative imaging of left hip arthroplasty.

## 2014-02-06 ENCOUNTER — Encounter (HOSPITAL_COMMUNITY): Payer: Self-pay | Admitting: Cardiovascular Disease

## 2014-02-27 ENCOUNTER — Other Ambulatory Visit: Payer: Self-pay | Admitting: Cardiovascular Disease

## 2014-02-27 NOTE — Telephone Encounter (Signed)
Rx(s) sent to pharmacy electronically.  

## 2014-03-05 ENCOUNTER — Other Ambulatory Visit: Payer: Self-pay | Admitting: Cardiovascular Disease

## 2014-03-05 NOTE — Telephone Encounter (Signed)
Rx(s) sent to pharmacy electronically.  

## 2014-03-28 IMAGING — NM NM RENAL IMAGING FLOW W/ PHARM
2 series · 12 of 12 positions shown · non-contrast
Comparison: Prior examination 10/12/2011.

RADIOPHARMACEUTICALS:  15.8 MCi 5c-QQm MAG3

CLINICAL DATA: History of right UPJ obstruction with stent
placement and removal in 8672.

EXAM:
NUCLEAR MEDICINE RENAL SCAN WITH DIURETIC ADMINISTRATION
TECHNIQUE: Radionuclide angiographic and sequential renal images were obtained
after intravenous injection of radiopharmaceutical. Imaging was
continued during slow intravenous injection of Lasix approximately
15 minutes after the start of the examination.

[Series 1: re renal qualitative · 9.51mm/px · 6 of 125 frames shown (1 of 2)]
[frame 11/125]
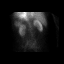
[frame 31/125]
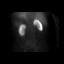
[frame 52/125]
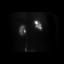
[frame 73/125]
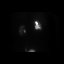
[frame 94/125]
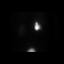
[frame 115/125]
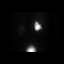

[Series 1: re renal qualitative · 9.51mm/px · 6 of 125 frames shown (2 of 2)]
[frame 11/125]
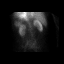
[frame 31/125]
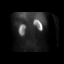
[frame 52/125]
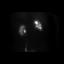
[frame 73/125]
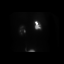
[frame 94/125]
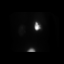
[frame 115/125]
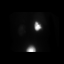

[12 of 12 positions shown; findings below may reference images not displayed]

FINDINGS: Flow:  Prompt symmetric arterial flow to the kidneys.

Left renogram: There is normal renal cortical uptake and excretion
into a nondilated collecting system. The renogram curve is
downsloping prior to Lasix administration.

Right renogram: There is normal renal cortical uptake and excretion
into and moderately dilated right renal pelvis. The renogram curve
is upsloping prior to Lasix administration. Following Lasix, some
washout and downsloping occurs.

Differential:

Left kidney = 41.5 %

Right kidney = 58.5 %

T1 half post Lasix :

Left kidney = 12.5 min

Right kidney = 21.0 min
IMPRESSION: Findings are consistent with a degree of residual or recurrent right
UPJ stenosis. There is some response to Lasix with partial washout
and downsloping of the renogram curve. Differential renal function
is now approximately 42% left and 58% right. The left renogram curve
is normal.

## 2014-03-30 ENCOUNTER — Other Ambulatory Visit: Payer: Self-pay | Admitting: Cardiovascular Disease

## 2014-04-01 NOTE — Telephone Encounter (Signed)
Rx refill sent to patient pharmacy   

## 2014-04-02 ENCOUNTER — Other Ambulatory Visit: Payer: Self-pay | Admitting: Cardiovascular Disease

## 2014-04-02 NOTE — Telephone Encounter (Signed)
Rx(s) sent to pharmacy electronically.  

## 2014-04-27 ENCOUNTER — Other Ambulatory Visit: Payer: Self-pay | Admitting: Cardiovascular Disease

## 2014-04-29 NOTE — Telephone Encounter (Signed)
Rx(s) sent to pharmacy electronically.  

## 2014-04-30 ENCOUNTER — Other Ambulatory Visit: Payer: Self-pay | Admitting: Cardiovascular Disease

## 2014-05-01 ENCOUNTER — Other Ambulatory Visit: Payer: Self-pay | Admitting: Cardiovascular Disease

## 2014-05-01 NOTE — Telephone Encounter (Signed)
Rx(s) sent to pharmacy electronically.  OV 05/24/14 

## 2014-05-02 NOTE — Telephone Encounter (Signed)
Rx(s) sent to pharmacy electronically.  OV 05/24/14 

## 2014-05-24 ENCOUNTER — Ambulatory Visit (INDEPENDENT_AMBULATORY_CARE_PROVIDER_SITE_OTHER): Payer: BC Managed Care – PPO | Admitting: Cardiovascular Disease

## 2014-05-24 ENCOUNTER — Encounter: Payer: Self-pay | Admitting: Cardiovascular Disease

## 2014-05-24 ENCOUNTER — Other Ambulatory Visit: Payer: Self-pay | Admitting: Cardiovascular Disease

## 2014-05-24 VITALS — BP 136/74 | HR 76 | Resp 16 | Ht 70.0 in | Wt 167.2 lb

## 2014-05-24 DIAGNOSIS — E785 Hyperlipidemia, unspecified: Secondary | ICD-10-CM

## 2014-05-24 DIAGNOSIS — Z79899 Other long term (current) drug therapy: Secondary | ICD-10-CM

## 2014-05-24 DIAGNOSIS — R934 Abnormal findings on diagnostic imaging of urinary organs: Secondary | ICD-10-CM

## 2014-05-24 DIAGNOSIS — I480 Paroxysmal atrial fibrillation: Secondary | ICD-10-CM

## 2014-05-24 DIAGNOSIS — R93429 Abnormal radiologic findings on diagnostic imaging of unspecified kidney: Secondary | ICD-10-CM

## 2014-05-24 DIAGNOSIS — Z8679 Personal history of other diseases of the circulatory system: Secondary | ICD-10-CM

## 2014-05-24 NOTE — Patient Instructions (Signed)
Your physician recommends that you return for lab work in: FASTING at your convenience.  Dr. Croitoru recommends that you schedule a follow-up appointment in: One year.    
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
## 2014-05-25 DIAGNOSIS — I480 Paroxysmal atrial fibrillation: Secondary | ICD-10-CM | POA: Insufficient documentation

## 2014-05-25 DIAGNOSIS — I4891 Unspecified atrial fibrillation: Secondary | ICD-10-CM | POA: Insufficient documentation

## 2014-05-25 HISTORY — DX: Paroxysmal atrial fibrillation: I48.0

## 2014-05-27 NOTE — Telephone Encounter (Signed)
Rx(s) sent to pharmacy electronically.  

## 2014-05-29 ENCOUNTER — Ambulatory Visit: Payer: BC Managed Care – PPO | Admitting: Cardiovascular Disease

## 2014-05-29 ENCOUNTER — Other Ambulatory Visit: Payer: Self-pay | Admitting: Cardiovascular Disease

## 2014-05-29 NOTE — Telephone Encounter (Signed)
Rx(s) sent to pharmacy electronically.  

## 2014-10-16 DIAGNOSIS — M25532 Pain in left wrist: Secondary | ICD-10-CM | POA: Diagnosis not present

## 2015-03-19 DIAGNOSIS — Q6211 Congenital occlusion of ureteropelvic junction: Secondary | ICD-10-CM | POA: Diagnosis not present

## 2015-03-19 DIAGNOSIS — Z8679 Personal history of other diseases of the circulatory system: Secondary | ICD-10-CM | POA: Diagnosis not present

## 2015-03-19 DIAGNOSIS — N5201 Erectile dysfunction due to arterial insufficiency: Secondary | ICD-10-CM | POA: Diagnosis not present

## 2015-03-19 DIAGNOSIS — R3912 Poor urinary stream: Secondary | ICD-10-CM | POA: Diagnosis not present

## 2015-03-19 DIAGNOSIS — N401 Enlarged prostate with lower urinary tract symptoms: Secondary | ICD-10-CM | POA: Diagnosis not present

## 2015-03-19 DIAGNOSIS — Z0000 Encounter for general adult medical examination without abnormal findings: Secondary | ICD-10-CM | POA: Diagnosis not present

## 2015-03-19 DIAGNOSIS — R3914 Feeling of incomplete bladder emptying: Secondary | ICD-10-CM | POA: Diagnosis not present

## 2015-05-22 ENCOUNTER — Other Ambulatory Visit: Payer: Self-pay | Admitting: Cardiovascular Disease

## 2015-05-22 NOTE — Telephone Encounter (Signed)
Rx(s) sent to pharmacy electronically.  

## 2015-05-31 ENCOUNTER — Other Ambulatory Visit: Payer: Self-pay | Admitting: Cardiovascular Disease

## 2015-06-02 NOTE — Telephone Encounter (Signed)
Rx(s) sent to pharmacy electronically.  

## 2015-06-30 ENCOUNTER — Other Ambulatory Visit: Payer: Self-pay

## 2015-06-30 MED ORDER — RAMIPRIL 10 MG PO CAPS: 10.0000 mg | ORAL_CAPSULE | Freq: Every day | ORAL | Status: DC

## 2015-07-02 ENCOUNTER — Other Ambulatory Visit: Payer: Self-pay | Admitting: Cardiovascular Disease

## 2015-07-02 MED ORDER — SIMVASTATIN 40 MG PO TABS: 40.0000 mg | ORAL_TABLET | Freq: Every day | ORAL | Status: DC

## 2015-07-02 NOTE — Telephone Encounter (Signed)
°*  STAT* If patient is at the pharmacy, call can be transferred to refill team.   1. Which medications need to be refilled? (please list name of each medication and dose if known) Ramipril 10mg  .   2. Which pharmacy/location (including street and city if local pharmacy) is medication to be sent to? [facility] on [address]  3. Do they need a 30 day or 90 day supply? 30

## 2015-07-04 ENCOUNTER — Telehealth: Payer: Self-pay | Admitting: *Deleted

## 2015-07-04 NOTE — Telephone Encounter (Signed)
Patient called and was very upset that his rx for ramipril had not been sent to the pharmacy. He would like this sent to [facility] on [address]. Thanks, MI

## 2015-07-04 NOTE — Telephone Encounter (Signed)
Rx Refilled  Monday, 06/30/2015

## 2015-07-07 ENCOUNTER — Other Ambulatory Visit: Payer: Self-pay | Admitting: Cardiovascular Disease

## 2015-07-07 ENCOUNTER — Telehealth: Payer: Self-pay | Admitting: *Deleted

## 2015-07-07 MED ORDER — RAMIPRIL 10 MG PO CAPS: 10.0000 mg | ORAL_CAPSULE | Freq: Every day | ORAL | Status: DC

## 2015-07-07 NOTE — Telephone Encounter (Signed)
°*  STAT* If patient is at the pharmacy, call can be transferred to refill team.   1. Which medications need to be refilled? (please list name of each medication and dose if known) Ramipril 10mg   2. Which pharmacy/location (including street and city if local pharmacy) is medication to be sent to?[facility] on [address]   3. Do they need a 30 day or 90 day supply? 30

## 2015-07-07 NOTE — Telephone Encounter (Signed)
Rx request sent to pharmacy.  

## 2015-07-07 NOTE — Telephone Encounter (Signed)
Patient called and is extremely upset that his ramipril rx is still not at [facility]. He stated that he needs this medication.

## 2015-07-07 NOTE — Telephone Encounter (Signed)
Med refilled in another encounter.

## 2015-08-04 ENCOUNTER — Ambulatory Visit: Payer: BC Managed Care – PPO | Admitting: Cardiovascular Disease

## 2015-08-25 ENCOUNTER — Other Ambulatory Visit: Payer: Self-pay | Admitting: Cardiovascular Disease

## 2015-08-25 NOTE — Telephone Encounter (Signed)
Rx request sent to pharmacy.  

## 2015-09-02 ENCOUNTER — Other Ambulatory Visit: Payer: Self-pay | Admitting: Cardiovascular Disease

## 2015-09-05 ENCOUNTER — Other Ambulatory Visit: Payer: Self-pay | Admitting: Cardiovascular Disease

## 2015-09-05 NOTE — Telephone Encounter (Signed)
Rx(s) sent to pharmacy electronically.  

## 2015-09-08 DIAGNOSIS — R809 Proteinuria, unspecified: Secondary | ICD-10-CM | POA: Diagnosis not present

## 2015-09-08 DIAGNOSIS — I119 Hypertensive heart disease without heart failure: Secondary | ICD-10-CM | POA: Diagnosis not present

## 2015-09-08 DIAGNOSIS — E782 Mixed hyperlipidemia: Secondary | ICD-10-CM | POA: Diagnosis not present

## 2015-09-08 DIAGNOSIS — N400 Enlarged prostate without lower urinary tract symptoms: Secondary | ICD-10-CM | POA: Diagnosis not present

## 2015-09-08 DIAGNOSIS — I429 Cardiomyopathy, unspecified: Secondary | ICD-10-CM | POA: Diagnosis not present

## 2015-09-08 DIAGNOSIS — E039 Hypothyroidism, unspecified: Secondary | ICD-10-CM | POA: Diagnosis not present

## 2015-09-08 DIAGNOSIS — N181 Chronic kidney disease, stage 1: Secondary | ICD-10-CM | POA: Diagnosis not present

## 2015-09-08 DIAGNOSIS — N529 Male erectile dysfunction, unspecified: Secondary | ICD-10-CM | POA: Diagnosis not present

## 2015-09-08 DIAGNOSIS — R490 Dysphonia: Secondary | ICD-10-CM | POA: Diagnosis not present

## 2015-09-08 DIAGNOSIS — Z0000 Encounter for general adult medical examination without abnormal findings: Secondary | ICD-10-CM | POA: Diagnosis not present

## 2015-09-22 DIAGNOSIS — S63501A Unspecified sprain of right wrist, initial encounter: Secondary | ICD-10-CM | POA: Diagnosis not present

## 2015-09-30 DIAGNOSIS — S63501D Unspecified sprain of right wrist, subsequent encounter: Secondary | ICD-10-CM | POA: Diagnosis not present

## 2015-10-05 ENCOUNTER — Telehealth: Payer: Self-pay | Admitting: Cardiovascular Disease

## 2015-10-06 ENCOUNTER — Ambulatory Visit (INDEPENDENT_AMBULATORY_CARE_PROVIDER_SITE_OTHER): Payer: BC Managed Care – PPO | Admitting: Cardiovascular Disease

## 2015-10-06 ENCOUNTER — Encounter (INDEPENDENT_AMBULATORY_CARE_PROVIDER_SITE_OTHER): Payer: Self-pay

## 2015-10-06 ENCOUNTER — Encounter: Payer: Self-pay | Admitting: Cardiovascular Disease

## 2015-10-06 VITALS — BP 106/72 | HR 84 | Ht 70.0 in | Wt 164.0 lb

## 2015-10-06 DIAGNOSIS — E785 Hyperlipidemia, unspecified: Secondary | ICD-10-CM

## 2015-10-06 DIAGNOSIS — I493 Ventricular premature depolarization: Secondary | ICD-10-CM

## 2015-10-06 DIAGNOSIS — I480 Paroxysmal atrial fibrillation: Secondary | ICD-10-CM

## 2015-10-06 DIAGNOSIS — I453 Trifascicular block: Secondary | ICD-10-CM | POA: Diagnosis not present

## 2015-10-06 DIAGNOSIS — I10 Essential (primary) hypertension: Secondary | ICD-10-CM | POA: Diagnosis not present

## 2015-10-06 HISTORY — DX: Essential (primary) hypertension: I10

## 2015-10-06 HISTORY — DX: Hyperlipidemia, unspecified: E78.5

## 2015-10-06 MED ORDER — RAMIPRIL 5 MG PO CAPS: 5.0000 mg | ORAL_CAPSULE | Freq: Every day | ORAL | 11 refills | Status: DC

## 2015-10-06 NOTE — Telephone Encounter (Signed)
Pt c/o medication issue:  1. Name of Medication: ramipril  2. How are you currently taking this medication (dosage and times per day)? 10mg  1xday  3. Are you having a reaction (difficulty breathing--STAT)? no 4. What is your medication issue? Medication was Refilled for 5mg , is this an error?

## 2015-10-06 NOTE — Patient Instructions (Signed)
Dr Royann Shivers has recommended making the following medication changes: 1. DECREASE Ramipril to 5 mg daily  Dr C recommends that you schedule a follow-up appointment in 1 year. You will receive a reminder letter in the mail two months in advance. If you don't receive a letter, please call our office to schedule the follow-up appointment.  If you need a refill on your cardiac medications before your next appointment, please call your pharmacy.
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
## 2015-10-06 NOTE — Telephone Encounter (Signed)
Called pharmacy staff to notify them that med change was made today 10/06/15 @ appointment.

## 2015-12-08 ENCOUNTER — Other Ambulatory Visit: Payer: Self-pay | Admitting: Cardiovascular Disease

## 2015-12-29 DIAGNOSIS — S92414A Nondisplaced fracture of proximal phalanx of right great toe, initial encounter for closed fracture: Secondary | ICD-10-CM | POA: Diagnosis not present

## 2016-03-15 DIAGNOSIS — E782 Mixed hyperlipidemia: Secondary | ICD-10-CM | POA: Diagnosis not present

## 2016-03-15 DIAGNOSIS — E039 Hypothyroidism, unspecified: Secondary | ICD-10-CM | POA: Diagnosis not present

## 2016-03-21 ENCOUNTER — Other Ambulatory Visit: Payer: Self-pay | Admitting: Cardiovascular Disease

## 2016-03-22 NOTE — Telephone Encounter (Signed)
Rx(s) sent to pharmacy electronically.  

## 2016-09-22 ENCOUNTER — Other Ambulatory Visit: Payer: Self-pay | Admitting: Cardiovascular Disease

## 2016-09-30 ENCOUNTER — Other Ambulatory Visit: Payer: Self-pay | Admitting: Cardiovascular Disease

## 2016-11-02 ENCOUNTER — Other Ambulatory Visit: Payer: Self-pay | Admitting: Cardiovascular Disease

## 2016-11-02 NOTE — Telephone Encounter (Signed)
Rx(s) sent to pharmacy electronically. MD OV 11/15/16

## 2016-11-15 ENCOUNTER — Encounter: Payer: Self-pay | Admitting: Cardiovascular Disease

## 2016-11-15 ENCOUNTER — Ambulatory Visit (INDEPENDENT_AMBULATORY_CARE_PROVIDER_SITE_OTHER): Payer: Medicare Other | Admitting: Cardiovascular Disease

## 2016-11-15 VITALS — BP 96/60 | HR 60 | Ht 70.0 in | Wt 165.0 lb

## 2016-11-15 DIAGNOSIS — Z79899 Other long term (current) drug therapy: Secondary | ICD-10-CM

## 2016-11-15 DIAGNOSIS — I10 Essential (primary) hypertension: Secondary | ICD-10-CM

## 2016-11-15 DIAGNOSIS — E785 Hyperlipidemia, unspecified: Secondary | ICD-10-CM

## 2016-11-15 DIAGNOSIS — I493 Ventricular premature depolarization: Secondary | ICD-10-CM

## 2016-11-15 DIAGNOSIS — I480 Paroxysmal atrial fibrillation: Secondary | ICD-10-CM

## 2016-11-15 DIAGNOSIS — I452 Bifascicular block: Secondary | ICD-10-CM | POA: Diagnosis not present
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
## 2016-11-15 NOTE — Patient Instructions (Signed)
Medication Instructions: Dr Croitoru recommends that you continue on your current medications as directed. Please refer to the Current Medication list given to you today.  Labwork: Your physician recommends that you return for lab work at your earliest convenience - FASTING.   Testing/Procedures: NONE ORDERED  Follow-up: Dr Croitoru recommends that you schedule a follow-up appointment in 12 months. You will receive a reminder letter in the mail two months in advance. If you don't receive a letter, please call our office to schedule the follow-up appointment.  If you need a refill on your cardiac medications before your next appointment, please call your pharmacy. 

## 2016-11-30 ENCOUNTER — Other Ambulatory Visit: Payer: Self-pay | Admitting: Cardiovascular Disease

## 2016-12-21 ENCOUNTER — Other Ambulatory Visit: Payer: Self-pay | Admitting: Cardiovascular Disease

## 2016-12-21 NOTE — Telephone Encounter (Signed)
REFILL 

## 2016-12-28 ENCOUNTER — Other Ambulatory Visit: Payer: Self-pay | Admitting: Cardiovascular Disease

## 2016-12-28 NOTE — Telephone Encounter (Signed)
Rx(s) sent to pharmacy electronically.  

## 2016-12-31 ENCOUNTER — Other Ambulatory Visit: Payer: Self-pay | Admitting: Cardiovascular Disease

## 2016-12-31 NOTE — Telephone Encounter (Signed)
Rx(s) sent to pharmacy electronically.  

## 2017-03-03 ENCOUNTER — Ambulatory Visit: Payer: Self-pay | Admitting: Surgery

## 2017-03-03 DIAGNOSIS — K4091 Unilateral inguinal hernia, without obstruction or gangrene, recurrent: Secondary | ICD-10-CM

## 2017-03-08 ENCOUNTER — Encounter: Payer: Self-pay | Admitting: Cardiovascular Disease

## 2017-03-08 ENCOUNTER — Telehealth: Payer: Self-pay

## 2017-03-08 NOTE — Telephone Encounter (Signed)
epicd 

## 2017-03-08 NOTE — Telephone Encounter (Signed)
   [facility] Pre-operative Risk Assessment    Request for surgical clearance:  1. What type of surgery is being performed? laparoscopic right (and possible left) inguinal hernia repair   2. When is this surgery scheduled? pending   3. Are there any medications that need to be held prior to surgery and how long? n/a   4. Practice name and name of physician performing surgery? Dr Michael Boston   5. What is your office phone and fax number? Ph: [phone], Fax: [phone] (ATTN: Illene Regulus, CMA)   6. Anesthesia type (None, local, MAC, general) ? not listed   Cotey Rakes, [place] 03/08/2017, 10:50 AM  _________________________________________________________________   (provider comments below)

## 2017-03-09 NOTE — Telephone Encounter (Signed)
Please clarify response:  "epicd" Tereso Newcomer, PA-C    03/09/2017 3:51 PM

## 2017-03-09 NOTE — Telephone Encounter (Signed)
Please see letter in patient's chart by [name], MD.  Please make sure fax letter to requesting provider was received.   Tereso Newcomer, PA-C    03/09/2017 4:50 PM

## 2017-03-09 NOTE — Telephone Encounter (Signed)
Surgical clearance letter in epic

## 2017-03-09 NOTE — Telephone Encounter (Signed)
Faxed to Alisha's attention via Epic.

## 2017-03-18 ENCOUNTER — Encounter (HOSPITAL_BASED_OUTPATIENT_CLINIC_OR_DEPARTMENT_OTHER): Payer: Self-pay | Admitting: *Deleted

## 2017-03-21 ENCOUNTER — Encounter (HOSPITAL_BASED_OUTPATIENT_CLINIC_OR_DEPARTMENT_OTHER): Payer: Self-pay | Admitting: *Deleted

## 2017-03-21 ENCOUNTER — Other Ambulatory Visit: Payer: Self-pay

## 2017-03-21 NOTE — Progress Notes (Addendum)
SPOKE W/ PT VIA PHONE FOR PRE-OP INTERVIEW.  NPO AFTER MN W/ EXCEPTION CLEAR LIQUIDS UNTIL 0730 (NO CREAM/ MILK PRODUCTS) AT WHICH TIME HE WILL DRINK ONE PRE-SURGERY ENDURE , PER ERAS ORDER.  PT WILL PICK-UP DRINK AT [facility] PST DEPARTMENT Tuesday BETWEEN 0830 -- 1500.  ARRIVE AT [facility] AT 0930.  NEEDS ISTAT .  CURRENT EKG IN CHART AND Epic.  WILL TAKE AM MEDS DOS W/ SIPS OF WATER WITH EXCEPTION NO RAMIPRIL. PT WILL DO HIBICLENS SHOWER NIGHT BEFORE SURGERY AND AM OF SURGERY, CHIN TO TOE.

## 2017-03-31 NOTE — Progress Notes (Signed)
Left message for pt to call back regarding time change.

## 2017-04-01 ENCOUNTER — Encounter (HOSPITAL_BASED_OUTPATIENT_CLINIC_OR_DEPARTMENT_OTHER): Payer: Self-pay

## 2017-04-01 ENCOUNTER — Ambulatory Visit (HOSPITAL_BASED_OUTPATIENT_CLINIC_OR_DEPARTMENT_OTHER): Payer: Medicare Other | Admitting: Anesthesiology

## 2017-04-01 ENCOUNTER — Ambulatory Visit (HOSPITAL_BASED_OUTPATIENT_CLINIC_OR_DEPARTMENT_OTHER)
Admission: RE | Admit: 2017-04-01 | Discharge: 2017-04-01 | Disposition: A | Discharge status: Home / Self Care | Payer: Medicare Other | Source: Ambulatory Visit | Attending: Surgery | Admitting: Surgery

## 2017-04-01 ENCOUNTER — Encounter (HOSPITAL_BASED_OUTPATIENT_CLINIC_OR_DEPARTMENT_OTHER)
Admission: RE | Disposition: A | Discharge status: Home / Self Care | Payer: Self-pay | Source: Ambulatory Visit | Attending: Surgery

## 2017-04-01 DIAGNOSIS — N529 Male erectile dysfunction, unspecified: Secondary | ICD-10-CM | POA: Insufficient documentation

## 2017-04-01 DIAGNOSIS — K4091 Unilateral inguinal hernia, without obstruction or gangrene, recurrent: Secondary | ICD-10-CM | POA: Diagnosis present

## 2017-04-01 DIAGNOSIS — K4120 Bilateral femoral hernia, without obstruction or gangrene, not specified as recurrent: Secondary | ICD-10-CM

## 2017-04-01 DIAGNOSIS — Z87891 Personal history of nicotine dependence: Secondary | ICD-10-CM | POA: Diagnosis not present

## 2017-04-01 DIAGNOSIS — N400 Enlarged prostate without lower urinary tract symptoms: Secondary | ICD-10-CM | POA: Diagnosis not present

## 2017-04-01 DIAGNOSIS — Z79899 Other long term (current) drug therapy: Secondary | ICD-10-CM | POA: Diagnosis not present

## 2017-04-01 DIAGNOSIS — E039 Hypothyroidism, unspecified: Secondary | ICD-10-CM | POA: Diagnosis not present

## 2017-04-01 DIAGNOSIS — I4891 Unspecified atrial fibrillation: Secondary | ICD-10-CM | POA: Insufficient documentation

## 2017-04-01 DIAGNOSIS — I10 Essential (primary) hypertension: Secondary | ICD-10-CM | POA: Insufficient documentation

## 2017-04-01 DIAGNOSIS — N200 Calculus of kidney: Secondary | ICD-10-CM | POA: Diagnosis not present

## 2017-04-01 HISTORY — DX: Bilateral femoral hernia, without obstruction or gangrene, not specified as recurrent: K41.20

## 2017-04-01 HISTORY — DX: Other specified cardiac arrhythmias: I49.8

## 2017-04-01 HISTORY — DX: Unilateral inguinal hernia, without obstruction or gangrene, recurrent: K40.91

## 2017-04-01 HISTORY — DX: Bifascicular block: I45.2

## 2017-04-01 HISTORY — PX: INGUINAL HERNIA REPAIR: SHX194

## 2017-04-01 HISTORY — DX: Paroxysmal atrial fibrillation: I48.0

## 2017-04-01 HISTORY — DX: Cardiac arrhythmia, unspecified: I49.9

## 2017-04-01 HISTORY — DX: Presence of spectacles and contact lenses: Z97.3

## 2017-04-01 HISTORY — PX: INSERTION OF MESH: SHX5868

## 2017-04-01 LAB — POCT I-STAT 4, (NA,K, GLUC, HGB,HCT)
Glucose, Bld: 84 mg/dL (ref 65–99)
HCT: 45 % (ref 39.0–52.0)
Hemoglobin: 15.3 g/dL (ref 13.0–17.0)
Potassium: 4.1 mmol/L (ref 3.5–5.1)
Sodium: 141 mmol/L (ref 135–145)

## 2017-04-01 SURGERY — REPAIR, HERNIA, INGUINAL, LAPAROSCOPIC
Anesthesia: General | Site: Inguinal | Laterality: Right

## 2017-04-01 MED ORDER — GABAPENTIN 300 MG PO CAPS
300.0000 mg | ORAL_CAPSULE | ORAL | Status: AC
  Administered 2017-04-01: 300 mg via ORAL
  Filled 2017-04-01: qty 1

## 2017-04-01 MED ORDER — GABAPENTIN 300 MG PO CAPS
ORAL_CAPSULE | ORAL | Status: AC
  Filled 2017-04-01: qty 1

## 2017-04-01 MED ORDER — LIDOCAINE 2% (20 MG/ML) 5 ML SYRINGE
INTRAMUSCULAR | Status: DC | PRN
  Administered 2017-04-01: 60 mg via INTRAVENOUS

## 2017-04-01 MED ORDER — BUPIVACAINE-EPINEPHRINE 0.25% -1:200000 IJ SOLN
INTRAMUSCULAR | Status: DC | PRN
  Administered 2017-04-01: 60 mL

## 2017-04-01 MED ORDER — FENTANYL CITRATE (PF) 100 MCG/2ML IJ SOLN
25.0000 ug | INTRAMUSCULAR | Status: DC | PRN
  Filled 2017-04-01: qty 1

## 2017-04-01 MED ORDER — SUGAMMADEX SODIUM 200 MG/2ML IV SOLN
INTRAVENOUS | Status: AC
  Filled 2017-04-01: qty 4

## 2017-04-01 MED ORDER — SODIUM CHLORIDE 0.9 % IV SOLN
250.0000 mL | INTRAVENOUS | Status: DC | PRN
  Filled 2017-04-01: qty 250

## 2017-04-01 MED ORDER — TRAMADOL HCL 50 MG PO TABS
ORAL_TABLET | ORAL | Status: AC
  Filled 2017-04-01: qty 1

## 2017-04-01 MED ORDER — EPHEDRINE 5 MG/ML INJ
INTRAVENOUS | Status: AC
  Filled 2017-04-01: qty 10

## 2017-04-01 MED ORDER — EPHEDRINE SULFATE 50 MG/ML IJ SOLN
INTRAMUSCULAR | Status: DC | PRN
  Administered 2017-04-01 (×5): 10 mg via INTRAVENOUS

## 2017-04-01 MED ORDER — DEXAMETHASONE SODIUM PHOSPHATE 10 MG/ML IJ SOLN
INTRAMUSCULAR | Status: AC
  Filled 2017-04-01: qty 1

## 2017-04-01 MED ORDER — SODIUM CHLORIDE 0.9% FLUSH
3.0000 mL | Freq: Two times a day (BID) | INTRAVENOUS | Status: DC
  Filled 2017-04-01: qty 3

## 2017-04-01 MED ORDER — ONDANSETRON HCL 4 MG/2ML IJ SOLN
INTRAMUSCULAR | Status: AC
  Filled 2017-04-01: qty 2

## 2017-04-01 MED ORDER — ENSURE PRE-SURGERY PO LIQD
296.0000 mL | Freq: Once | ORAL | Status: AC
  Administered 2017-04-01: 296 mL via ORAL
  Filled 2017-04-01: qty 296

## 2017-04-01 MED ORDER — CHLORHEXIDINE GLUCONATE CLOTH 2 % EX PADS
6.0000 | MEDICATED_PAD | Freq: Once | CUTANEOUS | Status: DC
  Filled 2017-04-01: qty 6

## 2017-04-01 MED ORDER — LACTATED RINGERS IV SOLN
INTRAVENOUS | Status: DC
  Administered 2017-04-01 (×2): via INTRAVENOUS
  Filled 2017-04-01: qty 1000

## 2017-04-01 MED ORDER — DEXAMETHASONE SODIUM PHOSPHATE 4 MG/ML IJ SOLN
4.0000 mg | INTRAMUSCULAR | Status: AC
  Administered 2017-04-01: 4 mg via INTRAVENOUS
  Filled 2017-04-01: qty 1

## 2017-04-01 MED ORDER — ROCURONIUM BROMIDE 100 MG/10ML IV SOLN
INTRAVENOUS | Status: DC | PRN
  Administered 2017-04-01: 20 mg via INTRAVENOUS
  Administered 2017-04-01: 50 mg via INTRAVENOUS

## 2017-04-01 MED ORDER — PROPOFOL 10 MG/ML IV BOLUS
INTRAVENOUS | Status: DC | PRN
  Administered 2017-04-01: 150 mg via INTRAVENOUS

## 2017-04-01 MED ORDER — OXYCODONE HCL 5 MG/5ML PO SOLN
5.0000 mg | Freq: Once | ORAL | Status: DC | PRN
  Filled 2017-04-01: qty 5

## 2017-04-01 MED ORDER — TRAMADOL HCL 50 MG PO TABS: 50.0000 mg | ORAL_TABLET | Freq: Four times a day (QID) | ORAL | 0 refills | Status: AC | PRN

## 2017-04-01 MED ORDER — OXYCODONE HCL 5 MG PO TABS
5.0000 mg | ORAL_TABLET | Freq: Once | ORAL | Status: DC | PRN
  Filled 2017-04-01: qty 1

## 2017-04-01 MED ORDER — PHENYLEPHRINE HCL 10 MG/ML IJ SOLN
INTRAMUSCULAR | Status: DC | PRN
  Administered 2017-04-01: 40 ug via INTRAVENOUS

## 2017-04-01 MED ORDER — CEFAZOLIN SODIUM-DEXTROSE 2-4 GM/100ML-% IV SOLN
2.0000 g | INTRAVENOUS | Status: AC
  Administered 2017-04-01: 2 g via INTRAVENOUS
  Filled 2017-04-01: qty 100

## 2017-04-01 MED ORDER — ACETAMINOPHEN 325 MG PO TABS
650.0000 mg | ORAL_TABLET | ORAL | Status: DC | PRN
  Filled 2017-04-01: qty 2

## 2017-04-01 MED ORDER — TRAMADOL HCL 50 MG PO TABS
50.0000 mg | ORAL_TABLET | Freq: Once | ORAL | Status: AC
  Administered 2017-04-01: 50 mg via ORAL
  Filled 2017-04-01: qty 1

## 2017-04-01 MED ORDER — FENTANYL CITRATE (PF) 100 MCG/2ML IJ SOLN
INTRAMUSCULAR | Status: AC
  Filled 2017-04-01: qty 2

## 2017-04-01 MED ORDER — CEFAZOLIN SODIUM-DEXTROSE 2-4 GM/100ML-% IV SOLN
INTRAVENOUS | Status: AC
  Filled 2017-04-01: qty 100

## 2017-04-01 MED ORDER — FENTANYL CITRATE (PF) 100 MCG/2ML IJ SOLN
INTRAMUSCULAR | Status: DC | PRN
  Administered 2017-04-01: 100 ug via INTRAVENOUS

## 2017-04-01 MED ORDER — ACETAMINOPHEN 500 MG PO TABS
ORAL_TABLET | ORAL | Status: AC
  Filled 2017-04-01: qty 2

## 2017-04-01 MED ORDER — PROPOFOL 500 MG/50ML IV EMUL
INTRAVENOUS | Status: AC
  Filled 2017-04-01: qty 50

## 2017-04-01 MED ORDER — SUGAMMADEX SODIUM 200 MG/2ML IV SOLN
INTRAVENOUS | Status: DC | PRN
  Administered 2017-04-01: 300 mg via INTRAVENOUS

## 2017-04-01 MED ORDER — PHENYLEPHRINE 40 MCG/ML (10ML) SYRINGE FOR IV PUSH (FOR BLOOD PRESSURE SUPPORT)
PREFILLED_SYRINGE | INTRAVENOUS | Status: AC
  Filled 2017-04-01: qty 10

## 2017-04-01 MED ORDER — OXYCODONE HCL 5 MG PO TABS
5.0000 mg | ORAL_TABLET | ORAL | Status: DC | PRN
  Filled 2017-04-01: qty 2

## 2017-04-01 MED ORDER — ACETAMINOPHEN 650 MG RE SUPP
650.0000 mg | RECTAL | Status: DC | PRN
  Filled 2017-04-01: qty 1

## 2017-04-01 MED ORDER — LIDOCAINE 2% (20 MG/ML) 5 ML SYRINGE
INTRAMUSCULAR | Status: AC
  Filled 2017-04-01: qty 5

## 2017-04-01 MED ORDER — ONDANSETRON HCL 4 MG/2ML IJ SOLN
4.0000 mg | Freq: Once | INTRAMUSCULAR | Status: DC | PRN
  Filled 2017-04-01: qty 2

## 2017-04-01 MED ORDER — SODIUM CHLORIDE 0.9 % IR SOLN
Status: DC | PRN
  Administered 2017-04-01: 1000 mL

## 2017-04-01 MED ORDER — SODIUM CHLORIDE 0.9% FLUSH
3.0000 mL | INTRAVENOUS | Status: DC | PRN
  Filled 2017-04-01: qty 3

## 2017-04-01 MED ORDER — ACETAMINOPHEN 500 MG PO TABS
1000.0000 mg | ORAL_TABLET | ORAL | Status: AC
  Administered 2017-04-01: 1000 mg via ORAL
  Filled 2017-04-01: qty 2

## 2017-04-01 SURGICAL SUPPLY — 46 items
BLADE SURG 11 STRL SS (BLADE) ×3 IMPLANT
CABLE HIGH FREQUENCY MONO STRZ (ELECTRODE) ×3 IMPLANT
CANISTER SUCT 3000ML PPV (MISCELLANEOUS) IMPLANT
CHLORAPREP W/TINT 26ML (MISCELLANEOUS) ×3 IMPLANT
COVER BACK TABLE 60X90IN (DRAPES) ×3 IMPLANT
COVER MAYO STAND STRL (DRAPES) ×3 IMPLANT
DECANTER SPIKE VIAL GLASS SM (MISCELLANEOUS) ×3 IMPLANT
DEVICE SECURE STRAP 25 ABSORB (INSTRUMENTS) IMPLANT
DRAPE LAPAROSCOPIC ABDOMINAL (DRAPES) ×3 IMPLANT
DRAPE UTILITY XL STRL (DRAPES) ×3 IMPLANT
DRAPE WARM FLUID 44X44 (DRAPE) ×3 IMPLANT
DRSG TEGADERM 2-3/8X2-3/4 SM (GAUZE/BANDAGES/DRESSINGS) ×3 IMPLANT
DRSG TEGADERM 4X4.75 (GAUZE/BANDAGES/DRESSINGS) ×3 IMPLANT
ELECT REM PT RETURN 9FT ADLT (ELECTROSURGICAL) ×3
ELECTRODE REM PT RTRN 9FT ADLT (ELECTROSURGICAL) ×2 IMPLANT
GAUZE SPONGE 2X2 12PLY NS (GAUZE/BANDAGES/DRESSINGS) ×3 IMPLANT
GLOVE ECLIPSE 8.0 STRL XLNG CF (GLOVE) ×3 IMPLANT
GLOVE INDICATOR 8.0 STRL GRN (GLOVE) ×3 IMPLANT
GOWN STRL REUS W/TWL XL LVL3 (GOWN DISPOSABLE) ×3 IMPLANT
IRRIG SUCT STRYKERFLOW 2 WTIP (MISCELLANEOUS) ×3
IRRIGATION SUCT STRKRFLW 2 WTP (MISCELLANEOUS) ×2 IMPLANT
IV NS IRRIG 3000ML ARTHROMATIC (IV SOLUTION) ×3 IMPLANT
KIT RM TURNOVER CYSTO AR (KITS) ×3 IMPLANT
MANIFOLD NEPTUNE II (INSTRUMENTS) IMPLANT
MARKER SKIN DUAL TIP RULER LAB (MISCELLANEOUS) ×3 IMPLANT
MESH ULTRAPRO 6X6 15CM15CM (Mesh General) ×9 IMPLANT
NEEDLE HYPO 22GX1.5 SAFETY (NEEDLE) ×3 IMPLANT
NS IRRIG 500ML POUR BTL (IV SOLUTION) ×3 IMPLANT
PACK BASIN DAY SURGERY FS (CUSTOM PROCEDURE TRAY) ×3 IMPLANT
PAD POSITIONING PINK XL (MISCELLANEOUS) ×3 IMPLANT
SCISSORS LAP 5X35 DISP (ENDOMECHANICALS) ×3 IMPLANT
SCISSORS LAP 5X45 EPIX DISP (ENDOMECHANICALS) IMPLANT
SLEEVE ADV FIXATION 5X100MM (TROCAR) ×3 IMPLANT
SPONGE GAUZE 2X2 8PLY STRL LF (GAUZE/BANDAGES/DRESSINGS) ×3 IMPLANT
SUT MNCRL AB 4-0 PS2 18 (SUTURE) ×3 IMPLANT
SUT VIC AB 2-0 SH 27 (SUTURE) ×2
SUT VIC AB 2-0 SH 27X BRD (SUTURE) ×2 IMPLANT
SUT VIC AB 2-0 SH 27XBRD (SUTURE) ×2 IMPLANT
SUT VICRYL 0 UR6 27IN ABS (SUTURE) ×3 IMPLANT
SYR 20CC LL (SYRINGE) ×6 IMPLANT
TOWEL OR 17X24 6PK STRL BLUE (TOWEL DISPOSABLE) ×6 IMPLANT
TRAY DSU PREP LF (CUSTOM PROCEDURE TRAY) ×3 IMPLANT
TROCAR ADV FIXATION 5X100MM (TROCAR) ×3 IMPLANT
TROCAR XCEL BLUNT TIP 100MML (ENDOMECHANICALS) ×3 IMPLANT
TUBING INSUF HEATED (TUBING) ×3 IMPLANT
WATER STERILE IRR 500ML POUR (IV SOLUTION) ×3 IMPLANT

## 2017-04-01 NOTE — Progress Notes (Signed)
Pt's bp 180/88. Hr 53.  Pt denies pain but is very upset regarding length of stay today and having to go home w catheter.  Pt takes ramipril, but didn't take today.  Dr. Renold Don called and informed.  Orders received to d/c pt home , but he is to take his ramipril as soon as he gets home and resume as usual in am. Pt and friend informed. Pt verbalized his understanding.

## 2017-04-01 NOTE — Progress Notes (Signed)
Pt voided only 50 cc's. w poor stream.  Pt stated "I just dribbled."  Dr. Johney Maine informed.  Orders received for cath.  Pt assisted to stretcher.  #16 fr. Foley inserted w mild difficulty.  Some resistance met. Foley to straight drainage, secured w leg strap.  Pt tolerated procedure fair.  400 cc's clear golden urine obtained.  Pt encouraged to force fluids 8-10 glasses /day.

## 2017-04-01 NOTE — Anesthesia Postprocedure Evaluation (Signed)
Anesthesia Post Note  Patient: Colton Montgomery  Procedure(s) Performed: LAPAROSCOPIC RIGHT  INGUINAL HERNIA  REPAIR, BILATERAL FEMORAL HERNIA REPAIR (Right Inguinal) INSERTION OF MESH (Bilateral Inguinal)     Patient location during evaluation: PACU Anesthesia Type: General Level of consciousness: awake and alert Pain management: pain level controlled Vital Signs Assessment: post-procedure vital signs reviewed and stable Respiratory status: spontaneous breathing, nonlabored ventilation and respiratory function stable Cardiovascular status: blood pressure returned to baseline and stable Postop Assessment: no apparent nausea or vomiting Anesthetic complications: no    Last Vitals:  Vitals:   04/01/17 1230 04/01/17 1245  BP: (!) 142/83   Pulse: (!) 54 (!) 59  Resp: 16 (!) 21  Temp:    SpO2: 100% 100%    Last Pain:  Vitals:   04/01/17 1300  TempSrc:   PainSc: 4                  Beryle Lathe

## 2017-04-01 NOTE — Discharge Instructions (Signed)
HERNIA REPAIR: POST OP INSTRUCTIONS ° °###################################################################### ° °EAT °Gradually transition to a high fiber diet with a fiber supplement over the next few weeks after discharge.  Start with a pureed / full liquid diet (see below) ° °WALK °Walk an hour a day.  Control your pain to do that.   ° °CONTROL PAIN °Control pain so that you can walk, sleep, tolerate sneezing/coughing, go up/down stairs. ° °HAVE A BOWEL MOVEMENT DAILY °Keep your bowels regular to avoid problems.  OK to try a laxative to override constipation.  OK to use an antidairrheal to slow down diarrhea.  Call if not better after 2 tries ° °CALL IF YOU HAVE PROBLEMS/CONCERNS °Call if you are still struggling despite following these instructions. °Call if you have concerns not answered by these instructions ° °###################################################################### ° ° ° °1. DIET: Follow a light bland diet the first 24 hours after arrival home, such as soup, liquids, crackers, etc.  Be sure to include lots of fluids daily.  Avoid fast food or heavy meals as your are more likely to get nauseated.  Eat a low fat the next few days after surgery. °2. Take your usually prescribed home medications unless otherwise directed. °3. PAIN CONTROL: °a. Pain is best controlled by a usual combination of three different methods TOGETHER: °i. Ice/Heat °ii. Over the counter pain medication °iii. Prescription pain medication °b. Most patients will experience some swelling and bruising around the hernia(s) such as the bellybutton, groins, or old incisions.  Ice packs or heating pads (30-60 minutes up to 6 times a day) will help. Use ice for the first few days to help decrease swelling and bruising, then switch to heat to help relax tight/sore spots and speed recovery.  Some people prefer to use ice alone, heat alone, alternating between ice & heat.  Experiment to what works for you.  Swelling and bruising can take  several weeks to resolve.   °c. It is helpful to take an over-the-counter pain medication regularly for the first few weeks.  Choose one of the following that works best for you: °i. Naproxen (Aleve, etc)  Two 220mg tabs twice a day °ii. Ibuprofen (Advil, etc) Three 200mg tabs four times a day (every meal & bedtime) °iii. Acetaminophen (Tylenol, etc) 325-650mg four times a day (every meal & bedtime) °d. A  prescription for pain medication should be given to you upon discharge.  Take your pain medication as prescribed.  °i. If you are having problems/concerns with the prescription medicine (does not control pain, nausea, vomiting, rash, itching, etc), please call us [phone] to see if we need to switch you to a different pain medicine that will work better for you and/or control your side effect better. °ii. If you need a refill on your pain medication, please contact your pharmacy.  They will contact our office to request authorization. Prescriptions will not be filled after 5 pm or on week-ends. °4. Avoid getting constipated.  Between the surgery and the pain medications, it is common to experience some constipation.  Increasing fluid intake and taking a fiber supplement (such as Metamucil, Citrucel, FiberCon, MiraLax, etc) 1-2 times a day regularly will usually help prevent this problem from occurring.  A mild laxative (prune juice, Milk of Magnesia, MiraLax, etc) should be taken according to package directions if there are no bowel movements after 48 hours.   °5. Wash / shower every day.  You may shower over the dressings as they are waterproof.   °6. Remove   your waterproof bandages 5 days after surgery.  You may leave the incision open to air.  You may replace a dressing/Band-Aid to cover the incision for comfort if you wish.  Continue to shower over incision(s) after the dressing is off. ° ° ° °7. ACTIVITIES as tolerated:   °a. You may resume regular (light) daily activities beginning the next day--such  as daily self-care, walking, climbing stairs--gradually increasing activities as tolerated.  If you can walk 30 minutes without difficulty, it is safe to try more intense activity such as jogging, treadmill, bicycling, low-impact aerobics, swimming, etc. °b. Save the most intensive and strenuous activity for last such as sit-ups, heavy lifting, contact sports, etc  Refrain from any heavy lifting or straining until you are off narcotics for pain control.   °c. DO NOT PUSH THROUGH PAIN.  Let pain be your guide: If it hurts to do something, don't do it.  Pain is your body warning you to avoid that activity for another week until the pain goes down. °d. You may drive when you are no longer taking prescription pain medication, you can comfortably wear a seatbelt, and you can safely maneuver your car and apply brakes. °e. You may have sexual intercourse when it is comfortable.  °8. FOLLOW UP in our office °a. Please call [facility] at [phone] to set up an appointment to see your surgeon in the office for a follow-up appointment approximately 2-3 weeks after your surgery. °b. Make sure that you call for this appointment the day you arrive home to insure a convenient appointment time. °9.  IF YOU HAVE DISABILITY OR FAMILY LEAVE FORMS, BRING THEM TO THE OFFICE FOR PROCESSING.  DO NOT GIVE THEM TO YOUR DOCTOR. ° °WHEN TO CALL US [phone]: °1. Poor pain control °2. Reactions / problems with new medications (rash/itching, nausea, etc)  °3. Fever over 101.5 F (38.5 C) °4. Inability to urinate °5. Nausea and/or vomiting °6. Worsening swelling or bruising °7. Continued bleeding from incision. °8. Increased pain, redness, or drainage from the incision ° ° The clinic staff is available to answer your questions during regular business hours (8:30am-5pm).  Please don’t hesitate to call and ask to speak to one of our nurses for clinical concerns.  ° If you have a medical emergency, go to the nearest emergency room or call  911. ° A surgeon from [facility] is always on call at the hospitals in [place] ° °[facility] °[address], [address], [place], [place]  [address] ? ° [address] [address], [place], [place]   [address] °MAIN: [phone] ? TOLL FREE: [phone] ? FAX: [phone] °[email or web address] ° ° °Inguinal Hernia, Adult °An inguinal hernia is when fat or the intestines push through the area where the leg meets the lower abdomen (groin) and create a rounded lump (bulge). This condition develops over time. There are three types of inguinal hernias. These types include: °· Hernias that can be pushed back into the belly (are reducible). °· Hernias that are not reducible (are incarcerated). °· Hernias that are not reducible and lose their blood supply (are strangulated). This type of hernia requires emergency surgery. ° °What are the causes? °This condition is caused by having a weak spot in the muscles or tissue. This weakness lets the hernia poke through. This condition can be triggered by: °· Suddenly straining the muscles of the lower abdomen. °· Lifting heavy objects. °· Straining to have a bowel movement. Difficult bowel movements (constipation)   can lead to this. °· Coughing. ° °What increases the risk? °This condition is more likely to develop in: °· Men. °· Pregnant women. °· People who: °? Are overweight. °? Work in jobs that require long periods of standing or heavy lifting. °? Have had an inguinal hernia before. °? Smoke or have lung disease. These factors can lead to long-lasting (chronic) coughing. ° °What are the signs or symptoms? °Symptoms can depend on the size of the hernia. Often, a small inguinal hernia has no symptoms. Symptoms of a larger hernia include: °· A lump in the groin. This is easier to see when the person is standing. It might not be visible when he or she is lying down. °· Pain or burning in the groin. This occurs especially when lifting,  straining, or coughing. °· A dull ache or a feeling of pressure in the groin. °· A lump in the scrotum in men. ° °Symptoms of a strangulated inguinal hernia can include: °· A bulge in the groin that is very painful and tender to the touch. °· A bulge that turns red or purple. °· Fever, nausea, and vomiting. °· The inability to have a bowel movement or to pass gas. ° °How is this diagnosed? °This condition is diagnosed with a medical history and physical exam. Your health care provider may feel your groin area and ask you to cough. °How is this treated? °Treatment for this condition varies depending on the size of your hernia and whether you have symptoms. If you do not have symptoms, your health care provider may have you watch your hernia carefully and come in for follow-up visits. If your hernia is larger or if you have symptoms, your treatment will include surgery. °Follow these instructions at home: °Lifestyle °· Drink enough fluid to keep your urine clear or pale yellow. °· Eat a diet that includes a lot of fiber. Eat plenty of fruits, vegetables, and whole grains. Talk with your health care provider if you have questions. °· Avoid lifting heavy objects. °· Avoid standing for long periods of time. °· Do not use tobacco products, including cigarettes, chewing tobacco, or e-cigarettes. If you need help quitting, ask your health care provider. °· Maintain a healthy weight. °General instructions °· Do not try to force the hernia back in. °· Watch your hernia for any changes in color or size. Let your health care provider know if any changes occur. °· Take over-the-counter and prescription medicines only as told by your health care provider. °· Keep all follow-up visits as told by your health care provider. This is important. °Contact a health care provider if: °· You have a fever. °· You have new symptoms. °· Your symptoms get worse. °Get help right away if: °· You have pain in the groin that suddenly gets  worse. °· A bulge in the groin gets bigger suddenly and does not go down. °· You are a man and you have a sudden pain in the scrotum, or the size of your scrotum suddenly changes. °· A bulge in the groin area becomes red or purple and is painful to the touch. °· You have nausea or vomiting that does not go away. °· You feel your heart beating a lot more quickly than normal. °· You cannot have a bowel movement or pass gas. °This information is not intended to replace advice given to you by your health care provider. Make sure you discuss any questions you have with your health care provider. °Document Released: 07/04/2008   Document Revised: 07/24/2015 Document Reviewed: 12/26/2013 °Elsevier Interactive Patient Education © 2018 [employer] ° ° ° °Post Anesthesia Home Care Instructions ° °Activity: °Get plenty of rest for the remainder of the day. A responsible individual must stay with you for 24 hours following the procedure.  °For the next 24 hours, DO NOT: °-Drive a car °-Operate machinery °-Drink alcoholic beverages °-Take any medication unless instructed by your physician °-Make any legal decisions or sign important papers. ° °Meals: °Start with liquid foods such as gelatin or soup. Progress to regular foods as tolerated. Avoid greasy, spicy, heavy foods. If nausea and/or vomiting occur, drink only clear liquids until the nausea and/or vomiting subsides. Call your physician if vomiting continues. ° °Special Instructions/Symptoms: °Your throat may feel dry or sore from the anesthesia or the breathing tube placed in your throat during surgery. If this causes discomfort, gargle with warm salt water. The discomfort should disappear within 24 hours. ° °If you had a scopolamine patch placed behind your ear for the management of post- operative nausea and/or vomiting: ° °1. The medication in the patch is effective for 72 hours, after which it should be removed.  Wrap patch in a tissue and discard in the trash. Wash  hands thoroughly with soap and water. °2. You may remove the patch earlier than 72 hours if you experience unpleasant side effects which may include dry mouth, dizziness or visual disturbances. °3. Avoid touching the patch. Wash your hands with soap and water after contact with the patch. °  ° °

## 2017-04-01 NOTE — Interval H&P Note (Signed)
History and Physical Interval Note:  04/01/2017 9:31 AM  Colton Montgomery  has presented today for surgery, with the diagnosis of recurrent right inguinal hernia.  The various methods of treatment have been discussed with the patient and family. After consideration of risks, benefits and other options for treatment, the patient has consented to  Procedure(s): LAPAROSCOPIC POSSIBLE OPEN RIGHT  INGUINAL HERNIA POSSIBLE LEFT (Right) INSERTION OF MESH (Right) as a surgical intervention .  The patient's history has been reviewed, patient examined, no change in status, stable for surgery.  I have reviewed the patient's chart and labs.  Questions were answered to the patient's satisfaction.     Ardeth Sportsman

## 2017-04-01 NOTE — Op Note (Signed)
04/01/2017  11:54 AM  PATIENT:  Colton Montgomery  77 y.o. male  Patient Care Team: [name], MD as PCP - General (Family Medicine) Croitoru, Rachelle Hora, MD as PCP - Cardiology (Cardiology) Karie Soda, MD as Consulting Physician (General Surgery)  PRE-OPERATIVE DIAGNOSIS:  recurrent right inguinal hernia.  POST-OPERATIVE DIAGNOSIS:    Recurrent right inguinal hernia. Bilateral femoral hernias  PROCEDURE:   LAPAROSCOPIC RIGHT INGUINAL HERNIA repair with mesh. Laparoscopic bilateral femoral hernia repairs with mesh. INSERTION OF MESH  SURGEON:  Ardeth Sportsman, MD  ASSISTANT: None  ANESTHESIA:     Regional ilioinguinal and genitofemoral and spermatic cord nerve blocks  General  EBL:  Total I/O In: 1000 [I.V.:1000] Out: - .  See anesthesia record  Delay start of Pharmacological VTE agent (>24hrs) due to surgical blood loss or risk of bleeding:  no  DRAINS: NONE  SPECIMEN:  NONE  DISPOSITION OF SPECIMEN:  N/A  COUNTS:  YES  PLAN OF CARE: Discharge to home after PACU  PATIENT DISPOSITION:  PACU - hemodynamically stable.  INDICATION: Pleasant 77 year old male with stable cardiac disease who has recurrent bulging in his right groin from prior hernia repair.  Suspicious for right hernia.  Some occasional groin and thigh discomfort.  I recommended laparoscopic exploration and repair of hernias found  The anatomy & physiology of the abdominal wall and pelvic floor was discussed.  The pathophysiology of hernias in the inguinal and pelvic region was discussed.  Natural history risks such as progressive enlargement, pain, incarceration & strangulation was discussed.   Contributors to complications such as smoking, obesity, diabetes, prior surgery, etc were discussed.    I feel the risks of no intervention will lead to serious problems that outweigh the operative risks; therefore, I recommended surgery to reduce and repair the hernia.  I explained laparoscopic techniques with  possible need for an open approach.  I noted usual use of mesh to patch and/or buttress hernia repair  Risks such as bleeding, infection, abscess, need for further treatment, heart attack, death, and other risks were discussed.  I noted a good likelihood this will help address the problem.   Goals of post-operative recovery were discussed as well.  Possibility that this will not correct all symptoms was explained.  I stressed the importance of low-impact activity, aggressive pain control, avoiding constipation, & not pushing through pain to minimize risk of post-operative chronic pain or injury. Possibility of reherniation was discussed.  We will work to minimize complications.     An educational handout further explaining the pathology & treatment options was given as well.  Questions were answered.  The patient expresses understanding & wishes to proceed with surgery.  OR FINDINGS: Moderate size indirect inguinal hernia on the right side with a femoral hernia as well.  No direct space nor obturator hernias.  On the left side, some laxity in his external ring but it looks like he had old stitch consistent with probable prior hernia repair on the left side.  No definite recurrence.  However a definite left femoral hernia.  No direct space nor obturator hernia  DESCRIPTION:  The patient was identified & brought into the operating room. The patient was positioned supine with arms tucked. SCDs were active during the entire case. The patient underwent general anesthesia without any difficulty.  The abdomen was prepped and draped in a sterile fashion. The patient's bladder was emptied.  A Surgical Timeout confirmed our plan.  I made a transverse incision through the inferior umbilical  fold.  I made a small transverse nick through the anterior rectus fascia contralateral to the inguinal hernia side and placed a 0-vicryl stitch through the fascia.  I placed a Hasson trocar into the preperitoneal plane.  Entry  was clean.  We induced carbon dioxide insufflation. Camera inspection [phone]-Amada Dulce Sella1[place] Spine And Pain SurgeGrRonaldo [phone] 111Lenell413-746-Amada Dulce [phone]-Amada Dulce Sella18rMariann LasCenter For Outpatient Surge1[place] EndoscoGrRonaldo [phone] 642Lenell330-635-Amada Dulce Sella72rMariann LasEast Coa(939) [phone]-Amada Dulce Sella52rMariann LasChaska Plaza Surgery Center [facility] D[facility]1Henry Mayo Newhall MemorialGrRonaldo MiyamotNew Jer863-97108Lenell731 860 Amada Dulce Sella64rMariann LasHighland Hospit1North RunnelsGrRonaldo MiyamoNew Jer(602) [phone]-Amada Dulce Sella30rMariann LasPrescott Outpatient Surgical Cent1[facility] CGrRonaldo MiyamotRanc([phone]-Amada Dulce Sella32rMariann LasSilver Hil606-202 290Lenell873-858-Amada Dulce Sella19rMariann LasChilton Memorial Hospit1Barnes-Kasson CountyGrRonaldo Miyam([phone] 623Lenell450-707-Amada Dulce Sella98rMariann LasHenry [phone]-Amada Dulce Sella34rMariann LasGastro Specialists Endoscopy Center L1Aspen SurgeGrRonaldo MiyamoNew [phone] Ambulatory Surgery And Spine Care [phone] Dulce Sella52rMariann [phone]-Amada Dulce Sella43rMariann LasWestfall Surgery Center L1Mccurtain [phone]([phone])272-Amada Dulce Sella56rMariann LasAvicenna Asc I1Specialists One Day Surgery LLC Dba Specialists One DaGrRonaldo [phone] [phone]-Amada Dulce Sella49rMariann LasEllsworth Municipal Hospit1Andalus[place] RegionalGrRonaldo MiyamoNew [phone] Dulce Sella76rMariann LasAspirus Riverview Hsptl [phone]-Amada Dulce Sella48rMariann LasNorthern New Jersey Center For Adv469 [phone] Amada Dulce Sella2rMariann LasPortland Clin1[facility] General SurgicalGrRonaldo MiyamotDa859-97102Lenell818-784-Amada Dulce Sella47rMariann LasOceans Hospital Of Broussa1Community Hos712 22910Lenell([phone])415-Amada Dulce Sella60rMariann LasBarnesville Hospital Association, I1Slings760-350LenellHe74yMariann LasParksi161GrRonaldo [phone] 238Lenell([phone] 307-Amada Dulce Sella62rMariann LasClay County Hospit1[place] RehabilitationGrRonaldo MiyamotNewcoNew J239-98475Lenell763-508-Amada Dulce Sella59rMariann LasMedstar Surgery Center At Timoni1[facility] St GrRonaldo [phone] 795Lenell517-455-Amada Dulce Sella9rMariann LasMorrison Community Hospit1802 [phone]-Amada Dulce Sella52rMariann LasKindred Hospital At St Rose [facility] Camp1[place] MedicGrRonaldo MiyamotCaNew [phone]([phone])701-Amada Dulce Sella74rMariann LasBelmont Community [phone])388Lenell(346) 457-Amada Dulce Sella17rMariann LasExcela [phone] 753Lenell(629) 675-Amada Dulce [phone]-Amada Dulce Sella25rMariann LasOphthalmology Center Of Bre[facility] Dba Asc Of Breva1Vernon M. Geddy Jr. OutpatieGrRonaldo [phone] [phone]-Amada Dulce Sella33rMariann LasThe Cookeville Surgery Cent1Kindred Hospital - TarraGrRonaldo [phone] Dulce Sella42rMariann LasAdc Surgicenter, LLC Dba [phone]-Amada Dulce Sella78rMariann LasRed Hi([phone] 807Lenell979 151 Amada Dulce Sella31rMariann LasJane Phillips Memorial Medical Cent1NorthsideGrRo(856) [phone] [phone] Dulce Sella44rMariann LasDavie Medical Cent1[place] Surgery CGrRonaldo MiyamotShNew Jerse([phone] Dulce Sella24rMariann LasLawrence General Hospit1Western StateGrRonaldo MiyamotChiNew [phone]-Amada Dulce Sella75rMariann LasMemorial Hospital, T1[facility] MedicGrRonaldo [phone] 781Lenell901-167-Amada Dulce Sella67rMariann LasCape And Islands Endoscopy Center L1[facility] Dba Confluence Health MosesGrRonaldo MiyamotNew [phone] 679Lenell(862) 537-Amada Dulce Sella70rMariann LasPost Acute Medical Specialty [facility] Regional MedicGrRonaldo [phone] 212Lenell262-451-Amada Dulce Sella83rMariann LasEdith Nourse Rogers Memorial Veterans [phone]-Amada Dulce Sella16rMariann LasFroedtert South Kenosha [facility] Cent1[place] MedicGrRonaldo [phone] Dulce Sella45rMariann LasSsm Health St Marys Janes[facility] Hospit1[place] S94504-929-060Lenell713-784-Amada Dulce Sella77rMariann LasAdvocate Eureka [phone]-Amada Dulce Sella73rMariann LasAustin Endoscopy Center I 1Mor[facility] Hospital & HealthcarGrRonaldo MiyamNew [phone]-Amada Dulce Sella46rMariann LasTallgrass Surgical Center L1[place] SurgeGrRonaldo MiyamotNe309-684-314Lenell([phone] 274-Amada Dulce Sella19rMariann LasHayes Green Beach Memorial Hospit1St Catherine MemorialGrRonaldo MiyamoNew Jerse343208-43781 692 [phone](385)584-Amada Dulce Sella57rMariann LasLiberty Eye Surgical Center L1Cbcc Pain Medicine And SurgeGrRonaldo [phone] 552Lenell954-297-Amada Dulce Sella28rMariann LasBroward Health Imperial Poi1[facility] - Behavioral HealthGrRonaldo MiyamotNew [phone]-Amada Dulce Sella1rMariann LasSt. Luke'S Elmo1Quillen RehabilitationGrRonaldo MiyaNew Jerse820-6929mProvidence [phone]-Amada Dulce Sella29rMariann [phone]-Amada Dulce Se([phone]-Amada Dulce Sella81rMariann LasVia Christi Clinic Surgery [phone]-Amada Dulce Sella24rMariann LasEncompass Health Rehabilitation Hospital Of The Mid-C[facility]1Del Val Asc Dba The Eye SurgeGrRonaldo MiyamotBeNew Jer409-850 328Lenell667-238-Amada Dulce Sella81rMariann LasRumford Hospit1Va612-984-580Lenell319-073-Amada Dulce Sella6rMariann LasNorth Texas State Hospit1[facility] GrRonaldo MiyamotGuthriNew [phone]) 107Lenell351-778-Amada Dulce Sella3rMariann LasHaywood Regional Medical Cent1Way[facility] Surgical CGrRo762-66772Lenell832-062-Amada Dulce Sella26rMariann LasSyringa Hospital & Clini1West Sh(832) 62942Lenell773-815-Amada Dulce Sella57rMariann LasDelmar Surgical Center L1Devereux [place] TreatmenGrRonaldo Miyamo33934 086Lenell872-725-Amada Dulce Sella47rMariann LasUnicoi County Memorial Hospit1Westside GrRonaldo MiyamoNew Jerse580 435 2094artersMeigs95mWest Los UTacey RuCa8657Hazeline JunkerAmerican ExpressClovi680-1478Dar<8657Hazeli46mChur63Community Hospital Eastc40 the RIGHT pelvis since that was the dominant hernia side.   I used blunt & focused sharp dissection to free the peritoneum off the flank and down to the pubic rim.  I freed the anteriolateral bladder wall off the anteriolateral pelvic wall, sparing midline attachments.   I located a swath of peritoneum going into a hernia fascial defect at the  internal ring consistent with  an indirect inguinal hernia..  I gradually freed the peritoneal hernia sac off safely and reduced it into the preperitoneal space.  I freed the peritoneum off the spermatic vessels & vas deferens.  With dissection he had an obvious right femoral hernia.  This was carefully reduced down..  Off some preperitoneal and iliac fat to better expose it.  I assured hemostasis.  I freed peritoneum off the retroperitoneum along the psoas muscle.  Spermatic cord lipoma was dissected away & removed.  I checked & assured hemostasis.  I did a high ligation on the sac to close down the peritoneal defect since it was a thinned out and somewhat fibrotic hernia sac.  Did that with 2-0 Vicryl laparoscopic intracorporeal suturing to good result.     I turned attention on the opposite  LEFT pelvis.  I did dissection in a similar, mirror-image fashion. The patient had a femoral hernia..   He did have some laxity in his external ring with fat coming up to it but on inspection it looks like he had ligation and possible mesh consistent with a probable left inguinal hernia repair.  Laxity there but no definite hernia.  However he definitely had a femoral hernia on the left side as well.  No direct space nor obturator  hernias.  There are a few small spermatic cord lipomas that were dissected away & removed.    I checked & assured hemostasis.  I did have to do ligation of the femoral sac also patch up a small peritoneal defect from the dissection.  Did then in a similar laparoscopic intracorporeal suturing with 2-0 Vicryl as well.  I chose 15x15 cm sheets of ultra-lightweight polypropylene mesh (Ultrapro), one for each side.  I cut a single sigmoid-shaped slit ~6cm from a corner of each mesh.  I placed the meshes into the preperitoneal space & laid them as overlapping diamonds such that at the inferior points, a 6x6 cm corner flap rested in the true anterolateral pelvis, covering the obturator & femoral foramina.   I allowed the bladder to return to the pubis, this helping tuck the corners of the mesh in the anteriolateral pelvis.  The medial corners overlapped each other across midline cephalad to the pubic rim.   Given the numerous hernias of moderate size, I placed a third 15x15cm mesh in the center as a vertical diamond.  The lateral wings of the mesh overlap across the direct spaces and internal rings where the dominant hernias were.  This provided good coverage and reinforcement  of the hernia repairs.  Because of the central mesh placement with good overlap, I did not place any tacks.   I held the hernia sacs cephalad & evacuated carbon dioxide.  I closed the fascia with absorbable suture.  I closed the skin using 4-0 monocryl stitch.  Sterile dressings were applied.   The patient was extubated & arrived in the PACU in stable condition..  I had discussed postoperative care with the patient in the holding area.  Instructions are written in the chart. I made an attempt to locate family to discuss patient's status and recommendations.  No one is available at this time.  Patient had mentioned that he did not want me to call or discuss with anyone.    Ardeth Sportsman, M.D., F.A.C.S. Gastrointestinal and Minimally  Invasive Surgery [facility] [address], [address] [place], [place] [address] [phone] Main / Paging  04/01/2017 11:54 AM

## 2017-04-01 NOTE — Anesthesia Procedure Notes (Signed)
Procedure Name: Intubation Date/Time: 04/01/2017 10:06 AM Performed by: Beryle Lathe, MD Pre-anesthesia Checklist: Patient identified, Emergency Drugs available, Suction available and Patient being monitored Patient Re-evaluated:Patient Re-evaluated prior to induction Oxygen Delivery Method: Circle system utilized Preoxygenation: Pre-oxygenation with 100% oxygen Induction Type: IV induction Ventilation: Mask ventilation without difficulty Laryngoscope Size: Miller and 2 Grade View: Grade I Tube type: Oral Tube size: 7.5 mm Number of attempts: 2 (First attempt by CRNA. Adequate view, unable to pass ETT. Second look by MD, grade 1 view. Required taking bend out of ETT to easily pass into trachea. Suspect prior attempt, bent tip of ETT was hitting anterior trachea.) Airway Equipment and Method: Stylet and Oral airway Placement Confirmation: ETT inserted through vocal cords under direct vision,  positive ETCO2 and breath sounds checked- equal and bilateral Secured at: 23 cm Tube secured with: Tape Dental Injury: Teeth and Oropharynx as per pre-operative assessment

## 2017-04-01 NOTE — Anesthesia Preprocedure Evaluation (Addendum)
Anesthesia Evaluation  Patient identified by MRN, date of birth, ID band Patient awake    Reviewed: Allergy & Precautions, H&P , NPO status , Patient's Chart, lab work & pertinent test results  Airway Mallampati: I  TM Distance: >3 FB Neck ROM: Full    Dental  (+) Teeth Intact, Caps   Pulmonary former smoker,    breath sounds clear to auscultation       Cardiovascular hypertension, Pt. on home beta blockers + dysrhythmias Atrial Fibrillation  Rhythm:Regular Rate:Normal  TTE 2014 - EF was in therange of 45% to 50%. Grade 1 diastolic dysfunction. Mild-mod LA dilatation.  EKG - PVCs, RBBB   Neuro/Psych negative neurological ROS  negative psych ROS   GI/Hepatic negative GI ROS, Neg liver ROS,   Endo/Other  Hypothyroidism   Renal/GU negative Renal ROS  negative genitourinary   Musculoskeletal  (+) Arthritis ,   Abdominal   Peds  Hematology negative hematology ROS (+)   Anesthesia Other Findings Chronic hoarseness d/t laryngeal crush injury - many anesthetics after this injury, denies history of difficult intubation  Reproductive/Obstetrics                          Anesthesia Physical  Anesthesia Plan  ASA: III  Anesthesia Plan: General   Post-op Pain Management:    Induction: Intravenous  PONV Risk Score and Plan: 3 and Treatment may vary due to age or medical condition, Ondansetron and Dexamethasone  Airway Management Planned: Oral ETT  Additional Equipment: None  Intra-op Plan:   Post-operative Plan: Extubation in OR  Informed Consent: I have reviewed the patients History and Physical, chart, labs and discussed the procedure including the risks, benefits and alternatives for the proposed anesthesia with the patient or authorized representative who has indicated his/her understanding and acceptance.   Dental advisory given  Plan Discussed with: CRNA  Anesthesia Plan Comments:         Anesthesia Quick Evaluation

## 2017-04-01 NOTE — Transfer of Care (Signed)
Immediate Anesthesia Transfer of Care Note  Patient: Colton Montgomery  Procedure(s) Performed: LAPAROSCOPIC RIGHT  INGUINAL HERNIA  REPAIR, BILATERAL FEMORAL HERNIA REPAIR (Right Inguinal) INSERTION OF MESH (Bilateral Inguinal)  Patient Location: PACU  Anesthesia Type:General  Level of Consciousness: awake  Airway & Oxygen Therapy: Patient Spontanous Breathing and Patient connected to face mask oxygen  Post-op Assessment: Report given to RN and Post -op Vital signs reviewed and stable  Post vital signs: Reviewed and stable  Last Vitals:  Vitals:   04/01/17 0748  BP: 126/68  Pulse: 82  Resp: 18  Temp: 36.6 C  SpO2: 100%    Last Pain:  Vitals:   04/01/17 0748  TempSrc: Oral      Patients Stated Pain Goal: 5 (04/01/17 0816)  Complications: No apparent anesthesia complications

## 2017-04-01 NOTE — H&P (Signed)
Colton Montgomery Location: [facility] DOB: November 22, 1940 Single / Language: English / Race: White Male   History of Present Illness  The patient is a 77 year old male who presents with an inguinal hernia. Note for "Inguinal hernia": ` ` ` Patient sent for surgical consultation at the request of Dr. Bjorn Pippin, [facility]  Chief Complaint: Recurrent Right inguinal hernia.  The patient is a pleasant 77 year old male. History of atrial fibrillation with cardioversion and sinus rhythm. Followed by Dr. Royann Shivers with cardiology. Had large indirect right inguinal hernia repaired in an open fashion with mesh in 2007. About 3 months ago he started noting some pain and swelling in his right groin. It is becoming more uncomfortable. It is gone from intermittently popping out to being out all the time now. Was concerned. Mentioned to his urologist. Concern on exam of recurrence. Surgical consultation requested by his urologist.  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 Used to be a heavy smoker but is not smoked in over 15 years. Sent numerous cardiac and vascular issues but is not on blood thinners. Not having a pacemaker. Sick couple miles a day. Bowel movements every day. Mild BPH urinary symptoms controlled.  (Review of systems as stated in this history (HPI) or in the review of systems. Otherwise all other 12 point ROS are negative)   Past Surgical History (Tanisha A. Manson Passey, RMA; 03/03/2017 9:36 AM) Hip Surgery  Bilateral. Knee Surgery  Bilateral. Open Inguinal Hernia Surgery  Bilateral. Oral Surgery   Diagnostic Studies History (Tanisha A. Manson Passey, RMA; 03/03/2017 9:36 AM) Colonoscopy  within last year  Allergies (Tanisha A. Manson Passey, RMA; 03/03/2017 9:38 AM) Tetracycline HCl *DERMATOLOGICALS*  Allergies Reconciled   Medication History (Tanisha A. Manson Passey, RMA; 03/03/2017 9:39  AM) Simvastatin (40MG  Tablet, Oral) Active. Ramipril (10MG  Capsule, Oral) Active. Carvedilol (25MG  Tablet, Oral) Active. Levothyroxine Sodium ( Tablet, Oral) Active. Medications Reconciled  Social History (Tanisha A. Manson Passey, RMA; 03/03/2017 9:36 AM) Alcohol use  Moderate alcohol use. Caffeine use  Carbonated beverages. Illicit drug use  Uses weekly. Tobacco use  Former smoker.  Family History (Tanisha A. Manson Passey, RMA; 03/03/2017 9:36 AM) Arthritis  Mother. Hypertension  Brother.  Other Problems (Tanisha A. Manson Passey, RMA; 03/03/2017 9:36 AM) Arthritis  Atrial Fibrillation  Inguinal Hernia  Kidney Stone     Review of Systems (Tanisha A. Brown RMA; 03/03/2017 9:36 AM) General Not Present- Appetite Loss, Chills, Fatigue, Fever, Night Sweats, Weight Gain and Weight Loss. Skin Not Present- Change in Wart/Mole, Dryness, Hives, Jaundice, New Lesions, Non-Healing Wounds, Rash and Ulcer. HEENT Present- Wears glasses/contact lenses. Not Present- Earache, Hearing Loss, Hoarseness, Nose Bleed, Oral Ulcers, Ringing in the Ears, Seasonal Allergies, Sinus Pain, Sore Throat, Visual Disturbances and Yellow Eyes. Respiratory Not Present- Bloody sputum, Chronic Cough, Difficulty Breathing, Snoring and Wheezing. Breast Not Present- Breast Mass, Breast Pain, Nipple Discharge and Skin Changes. Cardiovascular Not Present- Chest Pain, Difficulty Breathing Lying Down, Leg Cramps, Palpitations, Rapid Heart Rate, Shortness of Breath and Swelling of Extremities. Gastrointestinal Not Present- Abdominal Pain, Bloating, Bloody Stool, Change in Bowel Habits, Chronic diarrhea, Constipation, Difficulty Swallowing, Excessive gas, Gets full quickly at meals, Hemorrhoids, Indigestion, Nausea, Rectal Pain and Vomiting. Male Genitourinary Present- Change in Urinary Stream, Impotence and Urgency. Not Present- Blood in Urine, Frequency, Nocturia, Painful Urination and Urine Leakage. Musculoskeletal Not Present- Back  Pain, Joint Pain, Joint Stiffness, Muscle Pain, Muscle Weakness and Swelling of  Extremities. Neurological Not Present- Decreased Memory, Fainting, Headaches, Numbness, Seizures, Tingling, Tremor, Trouble walking and Weakness. Psychiatric Not Present- Anxiety, Bipolar, Change in Sleep Pattern, Depression, Fearful and Frequent crying. Endocrine Not Present- Cold Intolerance, Excessive Hunger, Hair Changes, Heat Intolerance, Hot flashes and New Diabetes. Hematology Not Present- Blood Thinners, Easy Bruising, Excessive bleeding, Gland problems, HIV and Persistent Infections.  Vitals (Tanisha A. Brown RMA; 03/03/2017 9:38 AM) 03/03/2017 9:37 AM Weight: 171 lb Height: 70in Body Surface Area: 1.95 m Body Mass Index: 24.54 kg/m  Temp.: 97.66F  Pulse: 60 (Regular)  BP: 112/78 (Sitting, Left Arm, Standard)       Physical Exam Ardeth Sportsman MD; 03/03/2017 10:40 AM) General Note: Moves normally. Not toxic. Bright. Alert.   ENMT Note: Moderate hoarseness. He's had that for 20 years.   Abdomen Note: Abdomen soft. Nontender, nondistended. No guarding. No diastasis. No umbilical nor other hernias   Male Genitourinary Note: Right lateral groin bulging consistent with recurrent inguinal hernia.  No pain or bulging on the left side. Normal external genitalia. Epididymi, testes, and spermatic cords normal without any masses.     Assessment & Plan (RECURRENT RIGHT INGUINAL HERNIA (K40.91) Impression: Lateral right groin bulging consistent with inguinal hernia. Most likely indirect recurrence. Prior open repair with mesh. No evidence of any recurrent left inguinal hernia.  I think he would benefit from hernia repair. He is rather active and and hurts him. I would do laparoscopic approach given the recurrence and prior open repair he is interested in proceeding.  Does have some moderate cardiac issues with a seem relatively stable with good exercise tolerance. Nonetheless, I'm  pretty sure anesthesia would like cardiac clearance first. Hopefully just a letter since the patient saw Dr. Royann Shivers only a few months ago.  The anatomy & physiology of the abdominal wall and pelvic floor was discussed. The pathophysiology of hernias in the inguinal and pelvic region was discussed. Natural history risks such as progressive enlargement, pain, incarceration, and strangulation was discussed. Contributors to complications such as smoking, obesity, diabetes, prior surgery, etc were discussed.  I feel the risks of no intervention will lead to serious problems that outweigh the operative risks; therefore, I recommended surgery to reduce and repair the hernia. I explained laparoscopic techniques with possible need for an open approach. I noted usual use of mesh to patch and/or buttress hernia repair  Risks such as bleeding, infection, abscess, need for further treatment, heart attack, death, and other risks were discussed. I noted a good likelihood this will help address the problem. Goals of post-operative recovery were discussed as well. Possibility that this will not correct all symptoms was explained. I stressed the importance of low-impact activity, aggressive pain control, avoiding constipation, & not pushing through pain to minimize risk of post-operative chronic pain or injury. Possibility of reherniation was discussed. We will work to minimize complications.  An educational handout further explaining the pathology & treatment options was given as well. Questions were answered. The patient expresses understanding & wishes to proceed with surgery.  Pt Education - Pamphlet Given - Laparoscopic Hernia Repair: discussed with patient and provided information. Pt Education - [facility] Pain Control (Masey Scheiber) Pt Education - [facility] Hernia Post-Op HCI (Zayon Trulson): discussed with patient and provided information.  Ardeth Sportsman, M.D., F.A.C.S. Gastrointestinal and Minimally Invasive  Surgery [facility] [address] [place], [place] [address] [phone] Main / Paging

## 2017-04-04 ENCOUNTER — Encounter (HOSPITAL_BASED_OUTPATIENT_CLINIC_OR_DEPARTMENT_OTHER): Payer: Self-pay | Admitting: Surgery

## 2017-10-31 ENCOUNTER — Other Ambulatory Visit: Payer: Self-pay | Admitting: Cardiovascular Disease

## 2017-12-24 ENCOUNTER — Other Ambulatory Visit: Payer: Self-pay | Admitting: Cardiovascular Disease

## 2017-12-26 NOTE — Telephone Encounter (Signed)
Rx request sent to pharmacy.  

## 2018-01-14 ENCOUNTER — Other Ambulatory Visit: Payer: Self-pay | Admitting: Cardiovascular Disease

## 2018-01-18 ENCOUNTER — Other Ambulatory Visit: Payer: Self-pay | Admitting: Cardiovascular Disease

## 2018-01-18 NOTE — Telephone Encounter (Signed)
Rx has been sent to the pharmacy electronically. ° °

## 2018-01-27 ENCOUNTER — Other Ambulatory Visit: Payer: Self-pay | Admitting: Cardiovascular Disease

## 2018-01-31 NOTE — Telephone Encounter (Signed)
Rx sent to pharmacy   

## 2018-04-03 ENCOUNTER — Other Ambulatory Visit: Payer: Self-pay | Admitting: Cardiovascular Disease

## 2018-04-03 NOTE — Telephone Encounter (Signed)
Rx(s) sent to pharmacy electronically.  

## 2018-06-08 ENCOUNTER — Other Ambulatory Visit: Payer: Self-pay | Admitting: Cardiovascular Disease

## 2018-07-21 ENCOUNTER — Other Ambulatory Visit: Payer: Self-pay | Admitting: Cardiovascular Disease

## 2018-09-07 ENCOUNTER — Other Ambulatory Visit: Payer: Self-pay | Admitting: Cardiovascular Disease

## 2018-09-19 ENCOUNTER — Telehealth: Payer: Self-pay | Admitting: *Deleted

## 2018-09-19 NOTE — Telephone Encounter (Signed)
Patient has been made aware to have his blood pressure, heart rate and weight ready tomorrow morning for the appointment.      Virtual Visit Pre-Appointment Phone Call  "(Name), I am calling you today to discuss your upcoming appointment. We are currently trying to limit exposure to the virus that causes COVID-19 by seeing patients at home rather than in the office."  1. "What is the BEST phone number to call the day of the visit?" - include this in appointment notes  2. "Do you have or have access to (through a family member/friend) a smartphone with video capability that we can use for your visit?" a. If yes - list this number in appt notes as "cell" (if different from BEST phone #) and list the appointment type as a VIDEO visit in appointment notes b. If no - list the appointment type as a PHONE visit in appointment notes  3. Confirm consent - "In the setting of the current Covid19 crisis, you are scheduled for a (phone or video) visit with your provider on (date) at (time).  Just as we do with many in-office visits, in order for you to participate in this visit, we must obtain consent.  If you'd like, I can send this to your mychart (if signed up) or email for you to review.  Otherwise, I can obtain your verbal consent now.  All virtual visits are billed to your insurance company just like a normal visit would be.  By agreeing to a virtual visit, we'd like you to understand that the technology does not allow for your provider to perform an examination, and thus may limit your provider's ability to fully assess your condition. If your provider identifies any concerns that need to be evaluated in person, we will make arrangements to do so.  Finally, though the technology is pretty good, we cannot assure that it will always work on either your or our end, and in the setting of a video visit, we may have to convert it to a phone-only visit.  In either situation, we cannot ensure that we have a secure  connection.  Are you willing to proceed?" Yes  4. Advise patient to be prepared - "Two hours prior to your appointment, go ahead and check your blood pressure, pulse, oxygen saturation, and your weight (if you have the equipment to check those) and write them all down. When your visit starts, your provider will ask you for this information. If you have an Apple Watch or Kardia device, please plan to have heart rate information ready on the day of your appointment. Please have a pen and paper handy nearby the day of the visit as well."  5. Give patient instructions for MyChart download to smartphone OR [email or web address] as below if video visit (depending on what platform provider is using)  6. Inform patient they will receive a phone call 15 minutes prior to their appointment time (may be from unknown caller ID) so they should be prepared to answer    TELEPHONE CALL NOTE  Fuquan P Gage has been deemed a candidate for a follow-up tele-health visit to limit community exposure during the Covid-19 pandemic. I spoke with the patient via phone to ensure availability of phone/video source, confirm preferred email & phone number, and discuss instructions and expectations.  I reminded Hassell Halim Laflam to be prepared with any vital sign and/or heart rhythm information that could potentially be obtained via home monitoring, at the time of his visit. I reminded  Jemiah P Mathenia to expect a phone call prior to his visit.  Ricci Barker, RN 09/19/2018 12:05 PM   INSTRUCTIONS FOR DOWNLOADING THE MYCHART APP TO SMARTPHONE  - The patient must first make sure to have activated MyChart and know their login information - If Apple, go to [employer] and type in MyChart in the search bar and download the app. If Android, ask patient to go to [employer] and type in [place] in the search bar and download the app. The app is free but as with any other app downloads, their phone may require them to verify saved  payment information or Apple/Android password.  - The patient will need to then log into the app with their MyChart username and password, and select [facility] as their healthcare provider to link the account. When it is time for your visit, go to the MyChart app, find appointments, and click Begin Video Visit. Be sure to Select Allow for your device to access the Microphone and Camera for your visit. You will then be connected, and your provider will be with you shortly.  **If they have any issues connecting, or need assistance please contact MyChart service desk [phone])**  **If using a computer, in order to ensure the best quality for their visit they will need to use either of the following Internet Browsers: [employer], or Google Chrome**  IF USING DOXIMITY or DOXY.ME - The patient will receive a link just prior to their visit by text.     FULL LENGTH CONSENT FOR TELE-HEALTH VISIT   I hereby voluntarily request, consent and authorize [facility] and its employed or contracted physicians, physician assistants, nurse practitioners or other licensed health care professionals (the Practitioner), to provide me with telemedicine health care services (the "Services") as deemed necessary by the treating Practitioner. I acknowledge and consent to receive the Services by the Practitioner via telemedicine. I understand that the telemedicine visit will involve communicating with the Practitioner through live audiovisual communication technology and the disclosure of certain medical information by electronic transmission. I acknowledge that I have been given the opportunity to request an in-person assessment or other available alternative prior to the telemedicine visit and am voluntarily participating in the telemedicine visit.  I understand that I have the right to withhold or withdraw my consent to the use of telemedicine in the course of my care at any time, without affecting  my right to future care or treatment, and that the Practitioner or I may terminate the telemedicine visit at any time. I understand that I have the right to inspect all information obtained and/or recorded in the course of the telemedicine visit and may receive copies of available information for a reasonable fee.  I understand that some of the potential risks of receiving the Services via telemedicine include:  Marland Kitchen Delay or interruption in medical evaluation due to technological equipment failure or disruption; . Information transmitted may not be sufficient (e.g. poor resolution of images) to allow for appropriate medical decision making by the Practitioner; and/or  . In rare instances, security protocols could fail, causing a breach of personal health information.  Furthermore, I acknowledge that it is my responsibility to provide information about my medical history, conditions and care that is complete and accurate to the best of my ability. I acknowledge that Practitioner's advice, recommendations, and/or decision may be based on factors not within their control, such as incomplete or inaccurate data provided by me or distortions of  diagnostic images or specimens that may result from electronic transmissions. I understand that the practice of medicine is not an exact science and that Practitioner makes no warranties or guarantees regarding treatment outcomes. I acknowledge that I will receive a copy of this consent concurrently upon execution via email to the email address I last provided but may also request a printed copy by calling the office of [facility].    I understand that my insurance will be billed for this visit.   I have read or had this consent read to me. . I understand the contents of this consent, which adequately explains the benefits and risks of the Services being provided via telemedicine.  . I have been provided ample opportunity to ask questions regarding this consent and the  Services and have had my questions answered to my satisfaction. . I give my informed consent for the services to be provided through the use of telemedicine in my medical care  By participating in this telemedicine visit I agree to the above.

## 2018-09-19 NOTE — Telephone Encounter (Signed)
Left a message for the patient to call back to change his appointment to a virtual one and get consent.

## 2018-09-20 ENCOUNTER — Telehealth (INDEPENDENT_AMBULATORY_CARE_PROVIDER_SITE_OTHER): Payer: Medicare Other | Admitting: Cardiovascular Disease

## 2018-09-20 ENCOUNTER — Encounter: Payer: Self-pay | Admitting: Cardiovascular Disease

## 2018-09-20 VITALS — BP 112/80 | Ht 70.0 in | Wt 163.0 lb

## 2018-09-20 DIAGNOSIS — I452 Bifascicular block: Secondary | ICD-10-CM | POA: Diagnosis not present

## 2018-09-20 DIAGNOSIS — I493 Ventricular premature depolarization: Secondary | ICD-10-CM | POA: Diagnosis not present

## 2018-09-20 DIAGNOSIS — Z8679 Personal history of other diseases of the circulatory system: Secondary | ICD-10-CM

## 2018-09-20 DIAGNOSIS — I10 Essential (primary) hypertension: Secondary | ICD-10-CM | POA: Diagnosis not present

## 2018-09-20 DIAGNOSIS — Z9889 Other specified postprocedural states: Secondary | ICD-10-CM

## 2018-09-20 DIAGNOSIS — E7800 Pure hypercholesterolemia, unspecified: Secondary | ICD-10-CM

## 2018-09-20 DIAGNOSIS — I480 Paroxysmal atrial fibrillation: Secondary | ICD-10-CM

## 2018-09-20 NOTE — Patient Instructions (Signed)
Medication Instructions:  Your physician recommends that you continue on your current medications as directed. Please refer to the Current Medication list given to you today.  If you need a refill on your cardiac medications before your next appointment, please call your pharmacy.   Lab work: None ordered If you have labs (blood work) drawn today and your tests are completely normal, you will receive your results only by: MyChart Message (if you have MyChart) OR A paper copy in the mail If you have any lab test that is abnormal or we need to change your treatment, we will call you to review the results.  Testing/Procedures: None ordered  Follow-Up: At [facility], you and your health needs are our priority.  As part of our continuing mission to provide you with exceptional heart care, we have created designated Provider Care Teams.  These Care Teams include your primary Cardiologist (physician) and Advanced Practice Providers (APPs -  Physician Assistants and Nurse Practitioners) who all work together to provide you with the care you need, when you need it. You will need a follow up appointment in 12 months.  Please call our office 2 months in advance to schedule this appointment.  You may see Mihai Croitoru. MD or one of the following Advanced Practice Providers on your designated Care Team: Hao Meng, PA-C Angela Duke, PA-C       
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
## 2018-12-06 ENCOUNTER — Other Ambulatory Visit: Payer: Self-pay | Admitting: Cardiovascular Disease

## 2018-12-07 NOTE — Telephone Encounter (Signed)
Pt taking ramipril 5 mg qd. Please advise if ok to refill medication not on pt's med list or last ov note.

## 2018-12-08 ENCOUNTER — Other Ambulatory Visit: Payer: Self-pay | Admitting: Cardiovascular Disease

## 2018-12-31 ENCOUNTER — Other Ambulatory Visit: Payer: Self-pay | Admitting: Cardiovascular Disease

## 2019-03-09 ENCOUNTER — Other Ambulatory Visit: Payer: Self-pay | Admitting: Cardiovascular Disease

## 2019-03-11 ENCOUNTER — Other Ambulatory Visit: Payer: Self-pay | Admitting: Cardiovascular Disease

## 2019-03-13 ENCOUNTER — Other Ambulatory Visit: Payer: Self-pay

## 2019-03-13 MED ORDER — RAMIPRIL 5 MG PO CAPS: 5.0000 mg | ORAL_CAPSULE | Freq: Every day | ORAL | 0 refills | Status: DC

## 2019-03-16 ENCOUNTER — Other Ambulatory Visit: Payer: Self-pay

## 2019-03-19 MED ORDER — RAMIPRIL 5 MG PO CAPS: 5.0000 mg | ORAL_CAPSULE | Freq: Every day | ORAL | 3 refills | Status: DC

## 2019-04-01 ENCOUNTER — Other Ambulatory Visit: Payer: Self-pay | Admitting: Cardiovascular Disease

## 2019-08-17 ENCOUNTER — Telehealth: Payer: Medicare HMO | Admitting: Cardiovascular Disease

## 2019-08-17 ENCOUNTER — Telehealth: Payer: Self-pay | Admitting: *Deleted

## 2019-08-17 NOTE — Telephone Encounter (Signed)
Attempted to reach the patient concerning his virtual appointment this morning with Dr. Royann Shivers. Left 2 messages for the patient to call back on his home phone. The mobile number listed rang busy.

## 2019-10-10 ENCOUNTER — Other Ambulatory Visit: Payer: Self-pay | Admitting: Cardiovascular Disease

## 2019-10-17 ENCOUNTER — Other Ambulatory Visit: Payer: Self-pay | Admitting: Cardiovascular Disease

## 2019-12-13 ENCOUNTER — Other Ambulatory Visit: Payer: Self-pay | Admitting: Cardiovascular Disease

## 2019-12-13 NOTE — Telephone Encounter (Signed)
*  STAT* If patient is at the pharmacy, call can be transferred to refill team.   1. Which medications need to be refilled? (please list name of each medication and dose if known) simvastatin (ZOCOR) 40 MG tablet  2. Which pharmacy/location (including street and city if local pharmacy) is medication to be sent to? [facility] [address] [place] - [address]  3. Do they need a 30 day or 90 day supply? 90   Patient scheduled for 03/13/20 with Dr. Royann Shivers

## 2019-12-14 MED ORDER — SIMVASTATIN 40 MG PO TABS: ORAL_TABLET | ORAL | 1 refills | Status: DC

## 2020-02-18 ENCOUNTER — Other Ambulatory Visit: Payer: Self-pay | Admitting: Cardiovascular Disease

## 2020-02-26 ENCOUNTER — Encounter: Payer: Self-pay | Admitting: Psychology

## 2020-03-13 ENCOUNTER — Ambulatory Visit: Payer: Medicare HMO | Admitting: Cardiovascular Disease

## 2020-03-13 ENCOUNTER — Other Ambulatory Visit: Payer: Self-pay | Admitting: Cardiovascular Disease

## 2020-04-02 ENCOUNTER — Other Ambulatory Visit: Payer: Self-pay

## 2020-04-02 ENCOUNTER — Ambulatory Visit (INDEPENDENT_AMBULATORY_CARE_PROVIDER_SITE_OTHER): Payer: Medicare HMO | Admitting: Psychology

## 2020-04-02 ENCOUNTER — Encounter: Payer: Self-pay | Admitting: Psychology

## 2020-04-02 DIAGNOSIS — G3184 Mild cognitive impairment, so stated: Secondary | ICD-10-CM | POA: Diagnosis not present

## 2020-04-02 DIAGNOSIS — I428 Other cardiomyopathies: Secondary | ICD-10-CM | POA: Insufficient documentation

## 2020-04-02 HISTORY — DX: Mild cognitive impairment of uncertain or unknown etiology: G31.84
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
## 2020-04-08 ENCOUNTER — Other Ambulatory Visit: Payer: Self-pay

## 2020-04-08 ENCOUNTER — Encounter: Payer: Self-pay | Admitting: Cardiovascular Disease

## 2020-04-08 ENCOUNTER — Ambulatory Visit (INDEPENDENT_AMBULATORY_CARE_PROVIDER_SITE_OTHER): Payer: Medicare HMO | Admitting: Cardiovascular Disease

## 2020-04-08 VITALS — BP 120/80 | HR 90 | Ht 70.5 in | Wt 167.2 lb

## 2020-04-08 DIAGNOSIS — I10 Essential (primary) hypertension: Secondary | ICD-10-CM | POA: Diagnosis not present

## 2020-04-08 DIAGNOSIS — I453 Trifascicular block: Secondary | ICD-10-CM

## 2020-04-08 DIAGNOSIS — E7800 Pure hypercholesterolemia, unspecified: Secondary | ICD-10-CM | POA: Diagnosis not present

## 2020-04-08 DIAGNOSIS — I483 Typical atrial flutter: Secondary | ICD-10-CM

## 2020-04-08 NOTE — Patient Instructions (Signed)

## 2020-04-09 ENCOUNTER — Other Ambulatory Visit: Payer: Self-pay | Admitting: Cardiovascular Disease
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
## 2020-04-17 ENCOUNTER — Encounter: Payer: Self-pay | Admitting: Neurology

## 2020-04-17 ENCOUNTER — Other Ambulatory Visit: Payer: Self-pay

## 2020-04-17 ENCOUNTER — Ambulatory Visit (INDEPENDENT_AMBULATORY_CARE_PROVIDER_SITE_OTHER): Payer: Medicare HMO | Admitting: Psychology

## 2020-04-17 DIAGNOSIS — G3184 Mild cognitive impairment, so stated: Secondary | ICD-10-CM | POA: Diagnosis not present

## 2020-04-17 NOTE — Progress Notes (Signed)
   Neuropsychology Feedback Session Colton Montgomery. [facility] Department of Neurology  Reason for Referral:   Colton Montgomery Pavorisis a 80 y.o. right-handed Caucasian male referred by [name], M.D.,to characterize hiscurrent cognitive functioning and assist with diagnostic clarity and treatment planning in the context of concerns surrounding ongoing memory dysfunction.   Feedback:   Colton Montgomery completed a comprehensive neuropsychological evaluation on 04/02/2020. Please refer to that encounter for the full report and recommendations. Briefly, results suggested a prominent impairment in retrieval and consolidation aspects of both verbal and visual memory. Executive functioning in the form of cognitive flexibility also represented a significant weakness, while performance variability was exhibited across encoding aspects of memory and expressive language. Specific to memory, Colton Montgomery was largely amnestic across all memory measures and exhibited poor performance across yes/no recognition items. This suggests a primary deficit in memory storage which is the hallmark characteristic of Alzheimer's disease. Executive dysfunction can accompany this condition and variability across semantic fluency and confrontation naming tasks likely suggest that this disease process is in the very early stages if it is indeed present. However, one evaluation is not sufficient to theorize the trajectory of his cognitive abilities and performance across visuospatial abilities is still quite strong.  Colton Montgomery was unaccompanied during the current feedback session. Content of the current session focused on the results of his neuropsychological evaluation. He appeared notably crestfallen when hearing the results and spent several minutes reading sections of the report to himself. Colton Montgomery was given the opportunity to ask questions and his questions were answered. He was encouraged to reach out should  additional questions arise. A copy of his report was provided at the conclusion of the visit.      16 minutes were spent conducting the current feedback session with Colton Montgomery, billed as one unit [phone].

## 2020-04-17 NOTE — Patient Instructions (Signed)
A repeat neuropsychological evaluation in 12-18 months (or sooner if functional decline is noted) is recommended to assess the trajectory of future cognitive decline should it occur. This will also aid in future efforts towards improved diagnostic clarity.  I do not believe that Colton Montgomery is currently being followed by a neurologist. Given memory deficits, I do believe that this would be prudent. I will place a referral for him. When he meets with his neurologist, he should discuss medication options (e.g., donepezil/Aricept) with this provider as these have been shown to slow progression of functional decline in some individuals. It is important to note that if a degenerative condition like Alzheimer's disease is present, no current treatment can stop or reverse cognitive decline. Referral for a brain MRI may also be useful to rule out other causes of cognitive dysfunction.   Should there be a progression of his current deficits over time, Colton Montgomery is unlikely to regain any independent living skills lost. Therefore, it is recommended that he remain as involved as possible in all aspects of household chores, finances, and medication management. Supervision may be beneficial to ensure adequate performance if family members are close to him. He will likely benefit from the establishment and maintenance of a routine in order to maximize his functional abilities over time.  If not already done, Colton Montgomery and his family may want to discuss his wishes regarding durable power of attorney and medical decision making, so that he can have input into these choices. Additionally, they may wish to discuss future plans for caretaking and seek out community options for in home/residential care should they become necessary.  Colton Montgomery is encouraged to attend to lifestyle factors for brain health (e.g., regular physical exercise, good nutrition habits, regular participation in cognitively-stimulating activities,  and general stress management techniques), which are likely to have benefits for both emotional adjustment and cognition. Optimal control of vascular risk factors (including safe cardiovascular exercise and adherence to dietary recommendations) is encouraged.   If interested, there are some activities which have therapeutic value and can be useful in keeping him cognitively stimulated. For suggestions, Colton Montgomery is encouraged to go to the following website: https://www.[facility].org/get-to-know-barrow/centers-programs/neurorehabilitation-center/neuro-rehab-apps-and-games/ which has options, categorized by level of difficulty. It should be noted that these activities should not be viewed as a substitute for therapy.  Information important to remember should be presented in written formats in all instances. This should be placed in a highly visible and commonly frequented location of his residence to help promote recall.   To address problems with executive dysfunction, he may wish to consider:   -Avoiding external distractions when needing to concentrate   -Limiting exposure to fast paced environments with multiple sensory demands   -Writing down complicated information and using checklists   -Attempting and completing one task at a time (i.e., no multi-tasking)   -Verbalizing aloud each step of a task to maintain focus   -Taking frequent breaks during the completion of steps/tasks to avoid fatigue   -Reducing the amount of information considered at one time

## 2020-06-04 DIAGNOSIS — G3184 Mild cognitive impairment, so stated: Secondary | ICD-10-CM | POA: Diagnosis not present

## 2020-06-04 DIAGNOSIS — I480 Paroxysmal atrial fibrillation: Secondary | ICD-10-CM | POA: Diagnosis not present

## 2020-06-04 DIAGNOSIS — I119 Hypertensive heart disease without heart failure: Secondary | ICD-10-CM | POA: Diagnosis not present

## 2020-06-04 DIAGNOSIS — E039 Hypothyroidism, unspecified: Secondary | ICD-10-CM | POA: Diagnosis not present

## 2020-06-04 DIAGNOSIS — E782 Mixed hyperlipidemia: Secondary | ICD-10-CM | POA: Diagnosis not present

## 2020-06-04 DIAGNOSIS — N181 Chronic kidney disease, stage 1: Secondary | ICD-10-CM | POA: Diagnosis not present

## 2020-07-23 ENCOUNTER — Ambulatory Visit: Payer: Medicare HMO | Admitting: Neurology

## 2020-07-23 DIAGNOSIS — N181 Chronic kidney disease, stage 1: Secondary | ICD-10-CM | POA: Insufficient documentation

## 2020-07-23 NOTE — Progress Notes (Deleted)
Assessment/Plan:   80 y.o. year old male with risk factors including hypertension, hyperlipidemia, NICM, hypothyroidism,atrial fibrillation s/p ablation, CKD stage 1, depression,  with a new diagnosis of Mild Neurocognitive Disorder, amnestic per Neuropsych evaluation on 04/17/2020. Patient is on Donepezil 5 mg daily by PCP  Recommendations are as follows   Mild Neurocognitive disorder, amnestic, likely due to Alzheimer's disease  . MRI of the brain to evaluate for bleeding, brain size and other abnormalities .  Repeat Neurocognitive testing in 12-18 months  . Check B12, TSH  . Discussed safety both in and out of the home.  . Discussed the importance of regular daily schedule with inclusion of crossword puzzles to maintain brain function.  . Continue to monitor mood with PCP.  . Stay active exercising  at least 30 minutes at least 3 times a week.  . Naps should be scheduled and should be no longer than 60 minutes and should not occur after 2 PM.  . Mediterranean diet is recommended  . Folllow up in 6 mo  . Continue Donepezil 5 mg daily  .   Subjective:    The patient is seen in neurologic consultation at the request of Rosann Auerbach, PhD for the evaluation of memory.  The patient is accompanied by {relatives:[id number]} who supplements the history.  The patient is a 80 y.o. year old male who has had memory issues for about    Patient feels that memory is Patient lives with  Mood is good, without depression or irritability. Denies hallucinations or paranoia. Sleeps.  Denies vivid dreams or sleepwalking. Patient dresses up and bathes without help.  Denies missing medications and uses a pillbox.   Denies living objects in unusual places. Patient's  took over finances.  Appetite is good. denies trouble swallowing.  The patient cooks. Denies leaving the stove on or the faucet on. Ambulating without difficulty without walker or cane. Patient continues to drive with GPS denies getting  lost. Patient no longer drives. Denies headaches, trauma, or injuries to the head, double vision,  dizziness, focal numbness or tingling, unilateral weakness or tremors. Denies urine incontinence or retention. Denies constipation or diarrhea. Denies history of OSA . Occasional ETOH  Quit Tobacco 20 yrs ago  Family History remarkable for father with Alzheimer's disease      Neuropsych evaluation 04/17/2020 reported "results suggested a prominent impairment in retrieval and consolidation aspects of both verbal and visual memory. Executive functioning in the form of cognitive flexibility also represented a significant weakness, while performance variability was exhibited across encoding aspects of memory and expressive language. Specific to memory, Mr. Waterson was largely amnestic across all memory measures and exhibited poor performance across yes/no recognition items. This suggests a primary deficit in memory storage which is the hallmark characteristic of Alzheimer's disease. Executive dysfunction can accompany this condition and variability across semantic fluency and confrontation naming tasks likely suggest that this disease process is in the very early stages if it is indeed present. However, one evaluation is not sufficient to theorize the trajectory of his cognitive abilities and performance across visuospatial abilities is still quite strong"  Allergies  Allergen Reactions  . Tetracyclines & Related Rash    Current Outpatient Medications  Medication Instructions  . carvedilol (COREG) 25 mg, Oral, 2 times daily with meals  . levothyroxine (SYNTHROID) 50 mcg, Oral, Daily before breakfast  . ramipril (ALTACE) 5 MG capsule TAKE ONE CAPSULE BY MOUTH DAILY  . simvastatin (ZOCOR) 40 MG tablet TAKE ONE TABLET  BY MOUTH AT BEDTIME  . traMADol (ULTRAM) 50-100 mg, Oral, Every 6 hours PRN     VITALS:  There were no vitals filed for this visit. No flowsheet data found.  HEENT:  Normocephalic,  atraumatic. The mucous membranes are moist. The superficial temporal arteries are without ropiness or tenderness. Cardiovascular: Regular rate and rhythm. Lungs: Clear to auscultation bilaterally. Neck: There are no carotid bruits noted bilaterally.  NEUROLOGICAL:  Orientation:  No flowsheet data found. Cranial nerves: There is good facial symmetry. Extraocular muscles are intact and visual fields are full to confrontational testing. Speech is fluent and clear. Soft palate rises symmetrically and there is no tongue deviation. Hearing is intact to conversational tone. Tone: Tone is good throughout. Sensation: Sensation is intact to light touch and pinprick throughout. Vibration is intact at the bilateral big toe. There is no extinction with double simultaneous stimulation. There is no sensory dermatomal level identified. Coordination:  The patient has no difficulty with RAM's or FNF bilaterally. Normal finger to nose  Motor: Strength is 5/5 in the bilateral upper and lower extremities. There is no pronator drift.  There are no fasciculations noted. DTR's: Deep tendon reflexes are 2/4 at the bilateral biceps, triceps, brachioradialis, patella and achilles.  Plantar responses are downgoing bilaterally. Gait and Station: The patient is able to ambulate without difficulty. The patient is able to heel toe walk without any difficulty. The patient is able to ambulate in a tandem fashion. The patient is able to stand in the Romberg position.      Total time spent on today's visit was *** 60 minutes, including both face-to-face time and nonface-to-face time.  Time included that spent on review of records (prior notes available to me/labs/imaging if pertinent), discussing treatment and goals, answering patient's questions and coordinating care.  Cc:  [name], MD  Marlowe Kays 07/23/2020 9:49 AM

## 2020-10-06 DIAGNOSIS — I480 Paroxysmal atrial fibrillation: Secondary | ICD-10-CM | POA: Diagnosis not present

## 2020-10-06 DIAGNOSIS — E039 Hypothyroidism, unspecified: Secondary | ICD-10-CM | POA: Diagnosis not present

## 2020-10-06 DIAGNOSIS — E782 Mixed hyperlipidemia: Secondary | ICD-10-CM | POA: Diagnosis not present

## 2020-10-06 DIAGNOSIS — E46 Unspecified protein-calorie malnutrition: Secondary | ICD-10-CM | POA: Diagnosis not present

## 2020-10-06 DIAGNOSIS — I119 Hypertensive heart disease without heart failure: Secondary | ICD-10-CM | POA: Diagnosis not present

## 2020-10-06 DIAGNOSIS — N181 Chronic kidney disease, stage 1: Secondary | ICD-10-CM | POA: Diagnosis not present

## 2020-10-06 DIAGNOSIS — G3184 Mild cognitive impairment, so stated: Secondary | ICD-10-CM | POA: Diagnosis not present

## 2020-10-06 DIAGNOSIS — R319 Hematuria, unspecified: Secondary | ICD-10-CM | POA: Diagnosis not present

## 2020-12-22 ENCOUNTER — Other Ambulatory Visit: Payer: Self-pay | Admitting: Cardiovascular Disease

## 2021-02-17 DIAGNOSIS — I10 Essential (primary) hypertension: Secondary | ICD-10-CM | POA: Diagnosis not present

## 2021-02-17 DIAGNOSIS — N181 Chronic kidney disease, stage 1: Secondary | ICD-10-CM | POA: Diagnosis not present

## 2021-02-17 DIAGNOSIS — E039 Hypothyroidism, unspecified: Secondary | ICD-10-CM | POA: Diagnosis not present

## 2021-02-17 DIAGNOSIS — E782 Mixed hyperlipidemia: Secondary | ICD-10-CM | POA: Diagnosis not present

## 2021-02-17 DIAGNOSIS — G3184 Mild cognitive impairment, so stated: Secondary | ICD-10-CM | POA: Diagnosis not present

## 2021-02-17 DIAGNOSIS — E46 Unspecified protein-calorie malnutrition: Secondary | ICD-10-CM | POA: Diagnosis not present

## 2021-02-17 DIAGNOSIS — N3946 Mixed incontinence: Secondary | ICD-10-CM | POA: Diagnosis not present

## 2021-02-17 DIAGNOSIS — I480 Paroxysmal atrial fibrillation: Secondary | ICD-10-CM | POA: Diagnosis not present

## 2021-02-17 DIAGNOSIS — F0390 Unspecified dementia without behavioral disturbance: Secondary | ICD-10-CM | POA: Diagnosis not present

## 2021-02-18 DIAGNOSIS — F09 Unspecified mental disorder due to known physiological condition: Secondary | ICD-10-CM | POA: Diagnosis not present

## 2021-02-18 DIAGNOSIS — R451 Restlessness and agitation: Secondary | ICD-10-CM | POA: Diagnosis not present

## 2021-02-18 DIAGNOSIS — F331 Major depressive disorder, recurrent, moderate: Secondary | ICD-10-CM | POA: Diagnosis not present

## 2021-02-19 DIAGNOSIS — Z79899 Other long term (current) drug therapy: Secondary | ICD-10-CM | POA: Diagnosis not present

## 2021-02-24 DIAGNOSIS — R2681 Unsteadiness on feet: Secondary | ICD-10-CM | POA: Diagnosis not present

## 2021-02-24 DIAGNOSIS — B070 Plantar wart: Secondary | ICD-10-CM | POA: Diagnosis not present

## 2021-02-24 DIAGNOSIS — B351 Tinea unguium: Secondary | ICD-10-CM | POA: Diagnosis not present

## 2021-02-24 DIAGNOSIS — M79674 Pain in right toe(s): Secondary | ICD-10-CM | POA: Diagnosis not present

## 2021-02-24 DIAGNOSIS — M79675 Pain in left toe(s): Secondary | ICD-10-CM | POA: Diagnosis not present

## 2021-02-24 DIAGNOSIS — L603 Nail dystrophy: Secondary | ICD-10-CM | POA: Diagnosis not present

## 2021-02-24 DIAGNOSIS — N183 Chronic kidney disease, stage 3 unspecified: Secondary | ICD-10-CM | POA: Diagnosis not present

## 2021-02-25 DIAGNOSIS — F0390 Unspecified dementia without behavioral disturbance: Secondary | ICD-10-CM | POA: Diagnosis not present

## 2021-02-25 DIAGNOSIS — I10 Essential (primary) hypertension: Secondary | ICD-10-CM | POA: Diagnosis not present

## 2021-02-25 DIAGNOSIS — E039 Hypothyroidism, unspecified: Secondary | ICD-10-CM | POA: Diagnosis not present

## 2021-02-25 DIAGNOSIS — N181 Chronic kidney disease, stage 1: Secondary | ICD-10-CM | POA: Diagnosis not present

## 2021-02-25 DIAGNOSIS — E559 Vitamin D deficiency, unspecified: Secondary | ICD-10-CM | POA: Diagnosis not present

## 2021-02-25 DIAGNOSIS — E782 Mixed hyperlipidemia: Secondary | ICD-10-CM | POA: Diagnosis not present

## 2021-02-25 DIAGNOSIS — F331 Major depressive disorder, recurrent, moderate: Secondary | ICD-10-CM | POA: Diagnosis not present

## 2021-02-27 DIAGNOSIS — N1831 Chronic kidney disease, stage 3a: Secondary | ICD-10-CM | POA: Diagnosis not present

## 2021-02-27 DIAGNOSIS — E039 Hypothyroidism, unspecified: Secondary | ICD-10-CM | POA: Diagnosis not present

## 2021-04-01 DIAGNOSIS — E559 Vitamin D deficiency, unspecified: Secondary | ICD-10-CM | POA: Diagnosis not present

## 2021-04-01 DIAGNOSIS — F0390 Unspecified dementia without behavioral disturbance: Secondary | ICD-10-CM | POA: Diagnosis not present

## 2021-04-04 DIAGNOSIS — N189 Chronic kidney disease, unspecified: Secondary | ICD-10-CM | POA: Diagnosis not present

## 2021-04-04 DIAGNOSIS — G309 Alzheimer's disease, unspecified: Secondary | ICD-10-CM | POA: Diagnosis not present

## 2021-04-04 DIAGNOSIS — Z79899 Other long term (current) drug therapy: Secondary | ICD-10-CM | POA: Diagnosis not present

## 2021-04-04 DIAGNOSIS — E7800 Pure hypercholesterolemia, unspecified: Secondary | ICD-10-CM | POA: Diagnosis not present

## 2021-04-04 DIAGNOSIS — R456 Violent behavior: Secondary | ICD-10-CM | POA: Diagnosis not present

## 2021-04-04 DIAGNOSIS — I129 Hypertensive chronic kidney disease with stage 1 through stage 4 chronic kidney disease, or unspecified chronic kidney disease: Secondary | ICD-10-CM | POA: Diagnosis not present

## 2021-04-04 DIAGNOSIS — E079 Disorder of thyroid, unspecified: Secondary | ICD-10-CM | POA: Diagnosis not present

## 2021-04-04 DIAGNOSIS — R778 Other specified abnormalities of plasma proteins: Secondary | ICD-10-CM | POA: Diagnosis not present

## 2021-04-04 DIAGNOSIS — F0283 Dementia in other diseases classified elsewhere, unspecified severity, with mood disturbance: Secondary | ICD-10-CM | POA: Diagnosis not present

## 2021-04-04 DIAGNOSIS — Z79890 Hormone replacement therapy (postmenopausal): Secondary | ICD-10-CM | POA: Diagnosis not present

## 2021-04-06 DIAGNOSIS — F09 Unspecified mental disorder due to known physiological condition: Secondary | ICD-10-CM | POA: Diagnosis not present

## 2021-04-06 DIAGNOSIS — F331 Major depressive disorder, recurrent, moderate: Secondary | ICD-10-CM | POA: Diagnosis not present

## 2021-04-06 DIAGNOSIS — R451 Restlessness and agitation: Secondary | ICD-10-CM | POA: Diagnosis not present

## 2021-05-06 DIAGNOSIS — F331 Major depressive disorder, recurrent, moderate: Secondary | ICD-10-CM | POA: Diagnosis not present

## 2021-05-13 DIAGNOSIS — F331 Major depressive disorder, recurrent, moderate: Secondary | ICD-10-CM | POA: Diagnosis not present

## 2021-05-13 DIAGNOSIS — F09 Unspecified mental disorder due to known physiological condition: Secondary | ICD-10-CM | POA: Diagnosis not present

## 2021-05-13 DIAGNOSIS — Z79899 Other long term (current) drug therapy: Secondary | ICD-10-CM | POA: Diagnosis not present

## 2021-05-13 DIAGNOSIS — R451 Restlessness and agitation: Secondary | ICD-10-CM | POA: Diagnosis not present

## 2021-05-15 DIAGNOSIS — Z79899 Other long term (current) drug therapy: Secondary | ICD-10-CM | POA: Diagnosis not present

## 2021-05-25 DIAGNOSIS — F0390 Unspecified dementia without behavioral disturbance: Secondary | ICD-10-CM | POA: Diagnosis not present

## 2021-05-25 DIAGNOSIS — N390 Urinary tract infection, site not specified: Secondary | ICD-10-CM | POA: Diagnosis not present

## 2021-05-29 DIAGNOSIS — Z79899 Other long term (current) drug therapy: Secondary | ICD-10-CM | POA: Diagnosis not present

## 2021-06-10 DIAGNOSIS — R892 Abnormal level of other drugs, medicaments and biological substances in specimens from other organs, systems and tissues: Secondary | ICD-10-CM | POA: Diagnosis not present

## 2021-06-10 DIAGNOSIS — M4312 Spondylolisthesis, cervical region: Secondary | ICD-10-CM | POA: Diagnosis not present

## 2021-06-10 DIAGNOSIS — S0990XA Unspecified injury of head, initial encounter: Secondary | ICD-10-CM | POA: Diagnosis not present

## 2021-06-10 DIAGNOSIS — I10 Essential (primary) hypertension: Secondary | ICD-10-CM | POA: Diagnosis not present

## 2021-06-10 DIAGNOSIS — M6281 Muscle weakness (generalized): Secondary | ICD-10-CM | POA: Diagnosis not present

## 2021-06-10 DIAGNOSIS — F17210 Nicotine dependence, cigarettes, uncomplicated: Secondary | ICD-10-CM | POA: Diagnosis not present

## 2021-06-10 DIAGNOSIS — I444 Left anterior fascicular block: Secondary | ICD-10-CM | POA: Diagnosis not present

## 2021-06-10 DIAGNOSIS — R5381 Other malaise: Secondary | ICD-10-CM | POA: Diagnosis not present

## 2021-06-10 DIAGNOSIS — I4510 Unspecified right bundle-branch block: Secondary | ICD-10-CM | POA: Diagnosis not present

## 2021-06-10 DIAGNOSIS — J387 Other diseases of larynx: Secondary | ICD-10-CM | POA: Diagnosis not present

## 2021-06-10 DIAGNOSIS — Y92198 Other place in other specified residential institution as the place of occurrence of the external cause: Secondary | ICD-10-CM | POA: Diagnosis not present

## 2021-06-10 DIAGNOSIS — R9431 Abnormal electrocardiogram [ECG] [EKG]: Secondary | ICD-10-CM | POA: Diagnosis not present

## 2021-06-10 DIAGNOSIS — W19XXXA Unspecified fall, initial encounter: Secondary | ICD-10-CM | POA: Diagnosis not present

## 2021-06-10 DIAGNOSIS — I440 Atrioventricular block, first degree: Secondary | ICD-10-CM | POA: Diagnosis not present

## 2021-06-10 DIAGNOSIS — M50323 Other cervical disc degeneration at C6-C7 level: Secondary | ICD-10-CM | POA: Diagnosis not present

## 2021-06-10 DIAGNOSIS — W01190A Fall on same level from slipping, tripping and stumbling with subsequent striking against furniture, initial encounter: Secondary | ICD-10-CM | POA: Diagnosis not present

## 2021-06-10 DIAGNOSIS — F0390 Unspecified dementia without behavioral disturbance: Secondary | ICD-10-CM | POA: Diagnosis not present

## 2021-06-10 DIAGNOSIS — F99 Mental disorder, not otherwise specified: Secondary | ICD-10-CM | POA: Diagnosis not present

## 2021-06-10 DIAGNOSIS — R279 Unspecified lack of coordination: Secondary | ICD-10-CM | POA: Diagnosis not present

## 2021-06-15 DIAGNOSIS — Z66 Do not resuscitate: Secondary | ICD-10-CM | POA: Diagnosis not present

## 2021-06-15 DIAGNOSIS — I629 Nontraumatic intracranial hemorrhage, unspecified: Secondary | ICD-10-CM | POA: Diagnosis not present

## 2021-06-15 DIAGNOSIS — G939 Disorder of brain, unspecified: Secondary | ICD-10-CM | POA: Diagnosis not present

## 2021-06-15 DIAGNOSIS — S066X9D Traumatic subarachnoid hemorrhage with loss of consciousness of unspecified duration, subsequent encounter: Secondary | ICD-10-CM | POA: Diagnosis not present

## 2021-06-15 DIAGNOSIS — F0390 Unspecified dementia without behavioral disturbance: Secondary | ICD-10-CM | POA: Diagnosis not present

## 2021-06-15 DIAGNOSIS — E870 Hyperosmolality and hypernatremia: Secondary | ICD-10-CM | POA: Diagnosis not present

## 2021-06-15 DIAGNOSIS — S299XXA Unspecified injury of thorax, initial encounter: Secondary | ICD-10-CM | POA: Diagnosis not present

## 2021-06-15 DIAGNOSIS — F02818 Dementia in other diseases classified elsewhere, unspecified severity, with other behavioral disturbance: Secondary | ICD-10-CM | POA: Diagnosis not present

## 2021-06-15 DIAGNOSIS — S0990XA Unspecified injury of head, initial encounter: Secondary | ICD-10-CM | POA: Diagnosis not present

## 2021-06-15 DIAGNOSIS — N281 Cyst of kidney, acquired: Secondary | ICD-10-CM | POA: Diagnosis not present

## 2021-06-15 DIAGNOSIS — S065X9D Traumatic subdural hemorrhage with loss of consciousness of unspecified duration, subsequent encounter: Secondary | ICD-10-CM | POA: Diagnosis not present

## 2021-06-15 DIAGNOSIS — R059 Cough, unspecified: Secondary | ICD-10-CM | POA: Diagnosis not present

## 2021-06-15 DIAGNOSIS — G309 Alzheimer's disease, unspecified: Secondary | ICD-10-CM | POA: Diagnosis not present

## 2021-06-15 DIAGNOSIS — W01198A Fall on same level from slipping, tripping and stumbling with subsequent striking against other object, initial encounter: Secondary | ICD-10-CM | POA: Diagnosis not present

## 2021-06-15 DIAGNOSIS — S061X0A Traumatic cerebral edema without loss of consciousness, initial encounter: Secondary | ICD-10-CM | POA: Diagnosis not present

## 2021-06-15 DIAGNOSIS — G8911 Acute pain due to trauma: Secondary | ICD-10-CM | POA: Diagnosis not present

## 2021-06-15 DIAGNOSIS — S066X0A Traumatic subarachnoid hemorrhage without loss of consciousness, initial encounter: Secondary | ICD-10-CM | POA: Diagnosis not present

## 2021-06-15 DIAGNOSIS — R6520 Severe sepsis without septic shock: Secondary | ICD-10-CM | POA: Diagnosis not present

## 2021-06-15 DIAGNOSIS — F03911 Unspecified dementia, unspecified severity, with agitation: Secondary | ICD-10-CM | POA: Diagnosis not present

## 2021-06-15 DIAGNOSIS — S065XAA Traumatic subdural hemorrhage with loss of consciousness status unknown, initial encounter: Secondary | ICD-10-CM | POA: Diagnosis not present

## 2021-06-15 DIAGNOSIS — R402243 Coma scale, best verbal response, confused conversation, at hospital admission: Secondary | ICD-10-CM | POA: Diagnosis not present

## 2021-06-15 DIAGNOSIS — J811 Chronic pulmonary edema: Secondary | ICD-10-CM | POA: Diagnosis not present

## 2021-06-15 DIAGNOSIS — J387 Other diseases of larynx: Secondary | ICD-10-CM | POA: Diagnosis not present

## 2021-06-15 DIAGNOSIS — S066XAA Traumatic subarachnoid hemorrhage with loss of consciousness status unknown, initial encounter: Secondary | ICD-10-CM | POA: Diagnosis not present

## 2021-06-15 DIAGNOSIS — E876 Hypokalemia: Secondary | ICD-10-CM | POA: Diagnosis not present

## 2021-06-15 DIAGNOSIS — U071 COVID-19: Secondary | ICD-10-CM | POA: Diagnosis not present

## 2021-06-15 DIAGNOSIS — G479 Sleep disorder, unspecified: Secondary | ICD-10-CM | POA: Diagnosis not present

## 2021-06-15 DIAGNOSIS — W19XXXA Unspecified fall, initial encounter: Secondary | ICD-10-CM | POA: Diagnosis not present

## 2021-06-15 DIAGNOSIS — I10 Essential (primary) hypertension: Secondary | ICD-10-CM | POA: Diagnosis not present

## 2021-06-15 DIAGNOSIS — N132 Hydronephrosis with renal and ureteral calculous obstruction: Secondary | ICD-10-CM | POA: Diagnosis not present

## 2021-06-15 DIAGNOSIS — S065X0D Traumatic subdural hemorrhage without loss of consciousness, subsequent encounter: Secondary | ICD-10-CM | POA: Diagnosis not present

## 2021-06-15 DIAGNOSIS — R0989 Other specified symptoms and signs involving the circulatory and respiratory systems: Secondary | ICD-10-CM | POA: Diagnosis not present

## 2021-06-15 DIAGNOSIS — R402363 Coma scale, best motor response, obeys commands, at hospital admission: Secondary | ICD-10-CM | POA: Diagnosis not present

## 2021-06-15 DIAGNOSIS — S0281XA Fracture of other specified skull and facial bones, right side, initial encounter for closed fracture: Secondary | ICD-10-CM | POA: Diagnosis not present

## 2021-06-15 DIAGNOSIS — R319 Hematuria, unspecified: Secondary | ICD-10-CM | POA: Diagnosis not present

## 2021-06-15 DIAGNOSIS — S3993XA Unspecified injury of pelvis, initial encounter: Secondary | ICD-10-CM | POA: Diagnosis not present

## 2021-06-15 DIAGNOSIS — S0291XA Unspecified fracture of skull, initial encounter for closed fracture: Secondary | ICD-10-CM | POA: Diagnosis not present

## 2021-06-15 DIAGNOSIS — R296 Repeated falls: Secondary | ICD-10-CM | POA: Diagnosis not present

## 2021-06-15 DIAGNOSIS — R918 Other nonspecific abnormal finding of lung field: Secondary | ICD-10-CM | POA: Diagnosis not present

## 2021-06-15 DIAGNOSIS — R402143 Coma scale, eyes open, spontaneous, at hospital admission: Secondary | ICD-10-CM | POA: Diagnosis not present

## 2021-06-15 DIAGNOSIS — S066X0D Traumatic subarachnoid hemorrhage without loss of consciousness, subsequent encounter: Secondary | ICD-10-CM | POA: Diagnosis not present

## 2021-06-15 DIAGNOSIS — M50323 Other cervical disc degeneration at C6-C7 level: Secondary | ICD-10-CM | POA: Diagnosis not present

## 2021-06-15 DIAGNOSIS — R531 Weakness: Secondary | ICD-10-CM | POA: Diagnosis not present

## 2021-06-15 DIAGNOSIS — R6521 Severe sepsis with septic shock: Secondary | ICD-10-CM | POA: Diagnosis not present

## 2021-06-15 DIAGNOSIS — R410 Disorientation, unspecified: Secondary | ICD-10-CM | POA: Diagnosis not present

## 2021-06-15 DIAGNOSIS — F0280 Dementia in other diseases classified elsewhere without behavioral disturbance: Secondary | ICD-10-CM | POA: Diagnosis not present

## 2021-06-15 DIAGNOSIS — C100 Malignant neoplasm of vallecula: Secondary | ICD-10-CM | POA: Diagnosis not present

## 2021-06-15 DIAGNOSIS — I4891 Unspecified atrial fibrillation: Secondary | ICD-10-CM | POA: Diagnosis not present

## 2021-06-15 DIAGNOSIS — F03918 Unspecified dementia, unspecified severity, with other behavioral disturbance: Secondary | ICD-10-CM | POA: Diagnosis not present

## 2021-06-15 DIAGNOSIS — R451 Restlessness and agitation: Secondary | ICD-10-CM | POA: Diagnosis not present

## 2021-06-15 DIAGNOSIS — S0634AA Traumatic hemorrhage of right cerebrum with loss of consciousness status unknown, initial encounter: Secondary | ICD-10-CM | POA: Diagnosis not present

## 2021-06-15 DIAGNOSIS — G4700 Insomnia, unspecified: Secondary | ICD-10-CM | POA: Diagnosis not present

## 2021-06-15 DIAGNOSIS — N1330 Unspecified hydronephrosis: Secondary | ICD-10-CM | POA: Diagnosis not present

## 2021-06-15 DIAGNOSIS — S065X0A Traumatic subdural hemorrhage without loss of consciousness, initial encounter: Secondary | ICD-10-CM | POA: Diagnosis not present

## 2021-06-15 DIAGNOSIS — I491 Atrial premature depolarization: Secondary | ICD-10-CM | POA: Diagnosis not present

## 2021-06-15 DIAGNOSIS — N320 Bladder-neck obstruction: Secondary | ICD-10-CM | POA: Diagnosis not present

## 2021-06-15 DIAGNOSIS — S06360A Traumatic hemorrhage of cerebrum, unspecified, without loss of consciousness, initial encounter: Secondary | ICD-10-CM | POA: Diagnosis not present

## 2021-06-15 DIAGNOSIS — S0219XA Other fracture of base of skull, initial encounter for closed fracture: Secondary | ICD-10-CM | POA: Diagnosis not present

## 2021-06-15 DIAGNOSIS — N390 Urinary tract infection, site not specified: Secondary | ICD-10-CM | POA: Diagnosis not present

## 2021-06-15 DIAGNOSIS — S0291XD Unspecified fracture of skull, subsequent encounter for fracture with routine healing: Secondary | ICD-10-CM | POA: Diagnosis not present

## 2021-06-15 DIAGNOSIS — M4802 Spinal stenosis, cervical region: Secondary | ICD-10-CM | POA: Diagnosis not present

## 2021-06-15 DIAGNOSIS — Z79899 Other long term (current) drug therapy: Secondary | ICD-10-CM | POA: Diagnosis not present

## 2021-06-15 DIAGNOSIS — R339 Retention of urine, unspecified: Secondary | ICD-10-CM | POA: Diagnosis not present

## 2021-06-15 DIAGNOSIS — E039 Hypothyroidism, unspecified: Secondary | ICD-10-CM | POA: Diagnosis not present

## 2021-06-15 DIAGNOSIS — I428 Other cardiomyopathies: Secondary | ICD-10-CM | POA: Diagnosis not present

## 2021-06-15 DIAGNOSIS — R9341 Abnormal radiologic findings on diagnostic imaging of renal pelvis, ureter, or bladder: Secondary | ICD-10-CM | POA: Diagnosis not present

## 2021-06-15 DIAGNOSIS — D72829 Elevated white blood cell count, unspecified: Secondary | ICD-10-CM | POA: Diagnosis not present

## 2021-06-15 DIAGNOSIS — G936 Cerebral edema: Secondary | ICD-10-CM | POA: Diagnosis not present

## 2021-06-15 DIAGNOSIS — A419 Sepsis, unspecified organism: Secondary | ICD-10-CM | POA: Diagnosis not present

## 2021-06-29 DEATH — deceased
